# Patient Record
Sex: Male | Born: 1955
Health system: Southern US, Community
[De-identification: ages and names within clinical notes are randomized; demographics above are authoritative.]

## PROBLEM LIST (undated history)

## (undated) DIAGNOSIS — M4722 Other spondylosis with radiculopathy, cervical region: Secondary | ICD-10-CM

## (undated) DIAGNOSIS — G4730 Sleep apnea, unspecified: Secondary | ICD-10-CM

## (undated) DIAGNOSIS — I484 Atypical atrial flutter: Secondary | ICD-10-CM

## (undated) DIAGNOSIS — G2581 Restless legs syndrome: Secondary | ICD-10-CM

## (undated) DIAGNOSIS — N400 Enlarged prostate without lower urinary tract symptoms: Secondary | ICD-10-CM

## (undated) DIAGNOSIS — M4712 Other spondylosis with myelopathy, cervical region: Secondary | ICD-10-CM

## (undated) HISTORY — PX: HERNIA REPAIR: SHX51

## (undated) HISTORY — DX: Atypical atrial flutter: I48.4

## (undated) HISTORY — PX: APPENDECTOMY: SHX54

## (undated) HISTORY — DX: Sleep apnea, unspecified: G47.30

## (undated) HISTORY — PX: TONSILLECTOMY: SUR1361

---

## 2004-12-29 ENCOUNTER — Ambulatory Visit: Payer: Self-pay | Admitting: Surgery

## 2005-06-16 ENCOUNTER — Ambulatory Visit: Payer: Self-pay | Admitting: Surgery

## 2007-10-24 HISTORY — PX: BACK SURGERY: SHX140

## 2008-02-05 ENCOUNTER — Ambulatory Visit: Payer: Self-pay | Admitting: Internal Medicine

## 2008-02-12 ENCOUNTER — Ambulatory Visit (HOSPITAL_COMMUNITY): Admission: RE | Admit: 2008-02-12 | Discharge: 2008-02-12 | Payer: Self-pay | Admitting: Neurosurgery

## 2009-10-25 ENCOUNTER — Ambulatory Visit: Payer: Self-pay | Admitting: Surgery

## 2010-11-04 ENCOUNTER — Ambulatory Visit: Payer: Self-pay | Admitting: Unknown Physician Specialty

## 2011-03-07 NOTE — Op Note (Signed)
NAMEHORACIO, Randall Shannon                 ACCOUNT NO.:  [id number]   MEDICAL RECORD NO.:  [id number]          PATIENT TYPE:  AMB   LOCATION:  SDS                          FACILITY:  [facility]   PHYSICIAN:  Cristi Loron, M.D.DATE OF BIRTH:  15-Jun-1956   DATE OF PROCEDURE:  02/12/2008  DATE OF DISCHARGE:                               OPERATIVE REPORT   PREOPERATIVE DIAGNOSES:  Right L4-5 herniated nucleus pulposus, disk  degeneration, spinal stenosis, lumbar radiculopathy, lumbago.   POSTOPERATIVE DIAGNOSES:  Right L4-5 herniated nucleus pulposus, disk  degeneration, spinal stenosis, lumbar radiculopathy, lumbago   PROCEDURE:  1. Right L5 laminectomy with right L4 laminotomy.  2. Decompress the right L5 and S1 nerve roots using microdissection.   BRIEF HISTORY:  The patient is a 55 year old white male who has suffered  from severe back and right leg pain consistent with a right L5 and S1  radiculopathy.  He failed medical measures, was worked up with a lumbar  MRI, which demonstrated the patient had a herniated disk behind the L5  vertebral body, which likely emanated from the L4-5 interspace.  He  failed medical management, and I have discussed the various treatment  options with him including surgery.  The patient has weighed the risks,  benefits, and alternatives of surgery, and decided to proceed with a  right L4-5 diskectomy.   SURGEON:  Cristi Loron, MD   ASSISTANT:  Clydene Fake, MD   ANESTHESIA:  General endotracheal.   ESTIMATED BLOOD LOSS:  50 mL.   SPECIMENS:  None.   DRAINS:  None.   COMPLICATIONS:  None.   DESCRIPTION OF PROCEDURE:  The patient was brought to the operating room  by the ICU team.  General endotracheal anesthesia was induced.  The  patient was then turned to the prone position on the Wilson frame.  His  lumbosacral region was then prepared with Betadine scrub and Betadine  solution.  Sterile drapes were applied. I then injected  the area  to be  incised with Marcaine with epinephrine solution.  I used a scalpel to  make a linear midline incision over the L4-5 and L5-S1 interspaces.  I  used electrocautery to perform a periosteal dissection exposing the  right spinous process and lamina of L4, L5, and at the sacrum.  We  obtained intraoperative radiograph to confirm our location and then we  inserted a McCullough retractor for exposure.  We then brought the  operative microscope into the field and on electromagnification and  illumination, we completed the microdissection/decompression.  We used a  high-speed drill to perform a right L4-L5 laminotomy.  I widened the  laminotomy at L4 using the Kerrison punch, removed the right L4-5  ligamentum flavum, completed the right L5 laminectomy using Kerrison  punch, and removed the cephalad aspect of the right L5-S1 ligamentum  flavum.  The patient had quite a bit of stenosis, which is  multifactorial likely secondary to disk herniation as well as  multifactorial spinal stenosis, facet arthropathy, and ligamentum flavum  hypertrophy.  I performed a laminotomy at the right L5 and  S1 nerve root  decompressing the lateral recess.  We then inspected the intervertebral  disk.  There was a large hole in the annulus at L4-5 with an impending  herniation.  We also encountered the expected large free fragment disk  herniation, which had migrated caudally over the L5 vertebral body.  There was quite a bit of compression of the right L5 nerve root to a  lesser extent the S1 nerve root.  We removed the disk herniation and  mobile fragments using microdissection.  We then again inspected the L4-  5 intervertebral disk.  Because of the large  impending disc herniation,  I therefore incised the annulus fibrosis within the scalpel.  We  performed a partial intervertebral diskectomy with the pituitary forceps  and curettes. After we were satisfied with the intervertebral  diskectomy, we used the  Acufex tool to remove some redundant posterior  longitudinal ligament from the vertebral endplates further decompressing  the neural structures.  We then used the coronary dilator to palpate  along the major surface of thecal sac and  along the exit route of the  right L5-S1 nerve root noting that all structures were well  decompressed.  We obtained hemostasis using bipolar electrocautery.  We  irrigated the wound out with bacitracin solution.  We then removed the  retractor and then reapproximated the patient's thoracolumbar fascia  with interrupted #1 Vicryl suture, subcutaneous tissue with interrupted  2-0 Vicryl suture, and the skin with Steri-Strips and benzoin.  The  wound was then closed with bacitracin ointment.  A sterile dressing was  applied.  The drapes were removed.  The patient was subsequently  returned to the supine position were he was extubated by the anesthesia  team and transported to the postanesthesia care unit in stable  condition.  All sponge, instrument, and needle counts were correct at  the end of the case.      Cristi Loron, M.D.  Electronically Signed     JDJ/MEDQ  D:  02/12/2008  T:  02/13/2008  Job:  [id number]

## 2011-07-05 ENCOUNTER — Ambulatory Visit: Payer: Self-pay | Admitting: Family Medicine

## 2011-07-18 LAB — CBC
HCT: 43.5
Hemoglobin: 14.6
MCHC: 33.6
MCV: 96.8
Platelets: 267
RBC: 4.49
RDW: 13.5
WBC: 6.6

## 2014-01-27 ENCOUNTER — Ambulatory Visit: Payer: Self-pay | Admitting: Internal Medicine

## 2014-01-28 ENCOUNTER — Other Ambulatory Visit: Payer: Self-pay | Admitting: Neurosurgery

## 2014-01-29 ENCOUNTER — Encounter (HOSPITAL_COMMUNITY): Payer: Self-pay | Admitting: Pharmacy Technician

## 2014-02-02 ENCOUNTER — Inpatient Hospital Stay (HOSPITAL_COMMUNITY): Admission: RE | Admit: 2014-02-02 | Payer: 59 | Source: Ambulatory Visit

## 2014-02-02 NOTE — Pre-Procedure Instructions (Signed)
CLIFTON SAFLEY  02/02/2014   Your procedure is scheduled on:  Wed, April 15 @ 5:10 PM  Report to [facility]  at 2:00 PM.  Call this number if you have problems the morning of surgery: [phone]   Remember:   Do not eat food or drink liquids after midnight.                 Stop taking your Ibuprofen. No Goody's,BC's,Aleve,Aspirin,Fish Oil,or any Herbal Medications   Do not wear jewelry  Do not wear lotions, powders, or colognes. You may wear deodorant.  Men may shave face and neck.  Do not bring valuables to the hospital.  [facility] is not responsible                  for any belongings or valuables.               Contacts, dentures or bridgework may not be worn into surgery.  Leave suitcase in the car. After surgery it may be brought to your room.  For patients admitted to the hospital, discharge time is determined by your                treatment team.               Patients discharged the day of surgery will not be allowed to drive  home.    Special Instructions:  [place] - Preparing for Surgery  Before surgery, you can play an important role.  Because skin is not sterile, your skin needs to be as free of germs as possible.  You can reduce the number of germs on you skin by washing with CHG (chlorahexidine gluconate) soap before surgery.  CHG is an antiseptic cleaner which kills germs and bonds with the skin to continue killing germs even after washing.  Please DO NOT use if you have an allergy to CHG or antibacterial soaps.  If your skin becomes reddened/irritated stop using the CHG and inform your nurse when you arrive at Short Stay.  Do not shave (including legs and underarms) for at least 48 hours prior to the first CHG shower.  You may shave your face.  Please follow these instructions carefully:   1.  Shower with CHG Soap the night before surgery and the                                morning of Surgery.  2.  If you choose to wash your hair, wash your  hair first as usual with your       normal shampoo.  3.  After you shampoo, rinse your hair and body thoroughly to remove the                      Shampoo.  4.  Use CHG as you would any other liquid soap.  You can apply chg directly       to the skin and wash gently with scrungie or a clean washcloth.  5.  Apply the CHG Soap to your body ONLY FROM THE NECK DOWN.        Do not use on open wounds or open sores.  Avoid contact with your eyes,       ears, mouth and genitals (private parts).  Wash genitals (private parts)       with your normal soap.  6.  Wash thoroughly, paying special attention to the area where your surgery        will be performed.  7.  Thoroughly rinse your body with warm water from the neck down.  8.  DO NOT shower/wash with your normal soap after using and rinsing off       the CHG Soap.  9.  Pat yourself dry with a clean towel.            10.  Wear clean pajamas.            11.  Place clean sheets on your bed the night of your first shower and do not        sleep with pets.  Day of Surgery  Do not apply any lotions/deoderants the morning of surgery.  Please wear clean clothes to the hospital/surgery center.     Please read over the following fact sheets that you were given: Pain Booklet, Coughing and Deep Breathing, MRSA Information and Surgical Site Infection Prevention

## 2014-02-03 ENCOUNTER — Encounter (HOSPITAL_COMMUNITY): Payer: Self-pay

## 2014-02-03 ENCOUNTER — Encounter (HOSPITAL_COMMUNITY)
Admission: RE | Admit: 2014-02-03 | Discharge: 2014-02-03 | Disposition: A | Discharge status: Home / Self Care | Payer: 59 | Source: Ambulatory Visit | Attending: Neurosurgery | Admitting: Neurosurgery

## 2014-02-03 LAB — BASIC METABOLIC PANEL
BUN: 17 mg/dL (ref 6–23)
CHLORIDE: 101 meq/L (ref 96–112)
CO2: 28 meq/L (ref 19–32)
Calcium: 9.5 mg/dL (ref 8.4–10.5)
Creatinine, Ser: 1.13 mg/dL (ref 0.50–1.35)
GFR calc Af Amer: 80 mL/min — ABNORMAL LOW (ref >90)
GFR calc non Af Amer: 69 mL/min — ABNORMAL LOW (ref >90)
GLUCOSE: 106 mg/dL — AB (ref 70–99)
POTASSIUM: 5.2 meq/L (ref 3.7–5.3)
SODIUM: 141 meq/L (ref 137–147)

## 2014-02-03 LAB — CBC
HEMATOCRIT: 46.1 % (ref 39.0–52.0)
HEMOGLOBIN: 16.2 g/dL (ref 13.0–17.0)
MCH: 33.9 pg (ref 26.0–34.0)
MCHC: 35.1 g/dL (ref 30.0–36.0)
MCV: 96.4 fL (ref 78.0–100.0)
Platelets: 291 10*3/uL (ref 150–400)
RBC: 4.78 MIL/uL (ref 4.22–5.81)
RDW: 12.5 % (ref 11.5–15.5)
WBC: 6.7 10*3/uL (ref 4.0–10.5)

## 2014-02-03 LAB — SURGICAL PCR SCREEN
MRSA, PCR: NEGATIVE
Staphylococcus aureus: NEGATIVE

## 2014-02-03 MED ORDER — CEFAZOLIN SODIUM-DEXTROSE 2-3 GM-% IV SOLR
2.0000 g | INTRAVENOUS | Status: AC
  Administered 2014-02-04: 2 g via INTRAVENOUS
  Filled 2014-02-03: qty 50

## 2014-02-04 ENCOUNTER — Encounter (HOSPITAL_COMMUNITY): Payer: 59 | Admitting: Anesthesiology

## 2014-02-04 ENCOUNTER — Ambulatory Visit (HOSPITAL_COMMUNITY): Payer: 59 | Admitting: Anesthesiology

## 2014-02-04 ENCOUNTER — Encounter (HOSPITAL_COMMUNITY)
Admission: RE | Disposition: A | Discharge status: Home / Self Care | Payer: Self-pay | Source: Ambulatory Visit | Attending: Neurosurgery

## 2014-02-04 ENCOUNTER — Encounter (HOSPITAL_COMMUNITY): Payer: Self-pay | Admitting: Surgery

## 2014-02-04 ENCOUNTER — Ambulatory Visit (HOSPITAL_COMMUNITY): Payer: 59

## 2014-02-04 ENCOUNTER — Observation Stay (HOSPITAL_COMMUNITY)
Admission: RE | Admit: 2014-02-04 | Discharge: 2014-02-05 | Disposition: A | Discharge status: Home / Self Care | Payer: 59 | Source: Ambulatory Visit | Attending: Neurosurgery | Admitting: Neurosurgery

## 2014-02-04 DIAGNOSIS — IMO0002 Reserved for concepts with insufficient information to code with codable children: Secondary | ICD-10-CM | POA: Insufficient documentation

## 2014-02-04 DIAGNOSIS — Z01812 Encounter for preprocedural laboratory examination: Secondary | ICD-10-CM | POA: Insufficient documentation

## 2014-02-04 DIAGNOSIS — M48062 Spinal stenosis, lumbar region with neurogenic claudication: Principal | ICD-10-CM | POA: Diagnosis present

## 2014-02-04 DIAGNOSIS — M48061 Spinal stenosis, lumbar region without neurogenic claudication: Secondary | ICD-10-CM | POA: Insufficient documentation

## 2014-02-04 HISTORY — PX: LUMBAR LAMINECTOMY/DECOMPRESSION MICRODISCECTOMY: SHX5026

## 2014-02-04 SURGERY — LUMBAR LAMINECTOMY/DECOMPRESSION MICRODISCECTOMY 1 LEVEL
Anesthesia: General

## 2014-02-04 MED ORDER — HYDROMORPHONE HCL PF 1 MG/ML IJ SOLN
0.2500 mg | INTRAMUSCULAR | Status: DC | PRN
  Administered 2014-02-04 (×2): 0.5 mg via INTRAVENOUS

## 2014-02-04 MED ORDER — ONDANSETRON HCL 4 MG/2ML IJ SOLN: 4.0000 mg | Freq: Once | INTRAMUSCULAR | Status: DC | PRN

## 2014-02-04 MED ORDER — PHENYLEPHRINE 40 MCG/ML (10ML) SYRINGE FOR IV PUSH (FOR BLOOD PRESSURE SUPPORT)
PREFILLED_SYRINGE | INTRAVENOUS | Status: AC
  Filled 2014-02-04: qty 10

## 2014-02-04 MED ORDER — ONDANSETRON HCL 4 MG/2ML IJ SOLN: 4.0000 mg | INTRAMUSCULAR | Status: DC | PRN

## 2014-02-04 MED ORDER — MIDAZOLAM HCL 5 MG/5ML IJ SOLN
INTRAMUSCULAR | Status: DC | PRN
  Administered 2014-02-04: 2 mg via INTRAVENOUS

## 2014-02-04 MED ORDER — BUPIVACAINE LIPOSOME 1.3 % IJ SUSP
INTRAMUSCULAR | Status: DC | PRN
  Administered 2014-02-04: 20 mL

## 2014-02-04 MED ORDER — OXYCODONE HCL 5 MG PO TABS: 5.0000 mg | ORAL_TABLET | Freq: Once | ORAL | Status: DC | PRN

## 2014-02-04 MED ORDER — PROPOFOL 10 MG/ML IV BOLUS
INTRAVENOUS | Status: AC
  Filled 2014-02-04: qty 20

## 2014-02-04 MED ORDER — MORPHINE SULFATE 2 MG/ML IJ SOLN
1.0000 mg | INTRAMUSCULAR | Status: DC | PRN
  Filled 2014-02-04: qty 1

## 2014-02-04 MED ORDER — TAMSULOSIN HCL 0.4 MG PO CAPS
0.4000 mg | ORAL_CAPSULE | Freq: Every day | ORAL | Status: DC
  Administered 2014-02-04: 0.4 mg via ORAL
  Filled 2014-02-04 (×2): qty 1

## 2014-02-04 MED ORDER — BACITRACIN ZINC 500 UNIT/GM EX OINT
TOPICAL_OINTMENT | CUTANEOUS | Status: DC | PRN
  Administered 2014-02-04: 1 via TOPICAL

## 2014-02-04 MED ORDER — PROPOFOL 10 MG/ML IV BOLUS
INTRAVENOUS | Status: DC | PRN
  Administered 2014-02-04: 200 mg via INTRAVENOUS

## 2014-02-04 MED ORDER — LIDOCAINE HCL (CARDIAC) 20 MG/ML IV SOLN
INTRAVENOUS | Status: AC
  Filled 2014-02-04: qty 5

## 2014-02-04 MED ORDER — DEXTROSE 5 % IV SOLN
10.0000 mg | INTRAVENOUS | Status: DC | PRN
  Administered 2014-02-04: 25 ug/min via INTRAVENOUS

## 2014-02-04 MED ORDER — FENTANYL CITRATE 0.05 MG/ML IJ SOLN
INTRAMUSCULAR | Status: AC
  Filled 2014-02-04: qty 5

## 2014-02-04 MED ORDER — PHENYLEPHRINE HCL 10 MG/ML IJ SOLN
INTRAMUSCULAR | Status: DC | PRN
Start: 2014-02-04 — End: 2014-02-04
  Administered 2014-02-04: 120 ug via INTRAVENOUS
  Administered 2014-02-04: 80 ug via INTRAVENOUS

## 2014-02-04 MED ORDER — OXYCODONE HCL 5 MG/5ML PO SOLN: 5.0000 mg | Freq: Once | ORAL | Status: DC | PRN

## 2014-02-04 MED ORDER — OXYCODONE-ACETAMINOPHEN 5-325 MG PO TABS
1.0000 | ORAL_TABLET | ORAL | Status: DC | PRN
  Administered 2014-02-05: 2 via ORAL
  Filled 2014-02-04: qty 2

## 2014-02-04 MED ORDER — BUPIVACAINE LIPOSOME 1.3 % IJ SUSP
20.0000 mL | INTRAMUSCULAR | Status: DC
  Filled 2014-02-04: qty 20

## 2014-02-04 MED ORDER — GLYCOPYRROLATE 0.2 MG/ML IJ SOLN
INTRAMUSCULAR | Status: DC | PRN
  Administered 2014-02-04: .7 mg via INTRAVENOUS

## 2014-02-04 MED ORDER — LACTATED RINGERS IV SOLN
INTRAVENOUS | Status: DC
  Administered 2014-02-04 (×2): via INTRAVENOUS

## 2014-02-04 MED ORDER — ROCURONIUM BROMIDE 100 MG/10ML IV SOLN
INTRAVENOUS | Status: DC | PRN
  Administered 2014-02-04: 50 mg via INTRAVENOUS

## 2014-02-04 MED ORDER — DIAZEPAM 5 MG PO TABS
5.0000 mg | ORAL_TABLET | Freq: Four times a day (QID) | ORAL | Status: DC | PRN
Start: 2014-02-04 — End: 2014-02-05

## 2014-02-04 MED ORDER — LACTATED RINGERS IV SOLN
INTRAVENOUS | Status: DC
  Administered 2014-02-05: 03:00:00 via INTRAVENOUS

## 2014-02-04 MED ORDER — ALUM & MAG HYDROXIDE-SIMETH 200-200-20 MG/5ML PO SUSP: 30.0000 mL | Freq: Four times a day (QID) | ORAL | Status: DC | PRN

## 2014-02-04 MED ORDER — LIDOCAINE HCL (CARDIAC) 20 MG/ML IV SOLN
INTRAVENOUS | Status: DC | PRN
  Administered 2014-02-04: 40 mg via INTRAVENOUS

## 2014-02-04 MED ORDER — DOCUSATE SODIUM 100 MG PO CAPS
100.0000 mg | ORAL_CAPSULE | Freq: Two times a day (BID) | ORAL | Status: DC
Start: 2014-02-04 — End: 2014-02-05
  Administered 2014-02-04 – 2014-02-05 (×2): 100 mg via ORAL
  Filled 2014-02-04 (×2): qty 1

## 2014-02-04 MED ORDER — ROCURONIUM BROMIDE 50 MG/5ML IV SOLN
INTRAVENOUS | Status: AC
  Filled 2014-02-04: qty 1

## 2014-02-04 MED ORDER — NEOSTIGMINE METHYLSULFATE 1 MG/ML IJ SOLN
INTRAMUSCULAR | Status: DC | PRN
  Administered 2014-02-04: 4 mg via INTRAVENOUS

## 2014-02-04 MED ORDER — HYDROCODONE-ACETAMINOPHEN 5-325 MG PO TABS
1.0000 | ORAL_TABLET | ORAL | Status: DC | PRN
  Administered 2014-02-04 – 2014-02-05 (×2): 2 via ORAL
  Filled 2014-02-04 (×3): qty 2

## 2014-02-04 MED ORDER — ARTIFICIAL TEARS OP OINT
TOPICAL_OINTMENT | OPHTHALMIC | Status: DC | PRN
  Administered 2014-02-04: 1 via OPHTHALMIC

## 2014-02-04 MED ORDER — PHENOL 1.4 % MT LIQD: 1.0000 | OROMUCOSAL | Status: DC | PRN

## 2014-02-04 MED ORDER — ONDANSETRON HCL 4 MG/2ML IJ SOLN
INTRAMUSCULAR | Status: DC | PRN
  Administered 2014-02-04: 4 mg via INTRAVENOUS

## 2014-02-04 MED ORDER — HEMOSTATIC AGENTS (NO CHARGE) OPTIME
TOPICAL | Status: DC | PRN
  Administered 2014-02-04: 1 via TOPICAL

## 2014-02-04 MED ORDER — SODIUM CHLORIDE 0.9 % IR SOLN
Status: DC | PRN
  Administered 2014-02-04: 17:00:00

## 2014-02-04 MED ORDER — CEFAZOLIN SODIUM-DEXTROSE 2-3 GM-% IV SOLR
2.0000 g | Freq: Three times a day (TID) | INTRAVENOUS | Status: AC
  Administered 2014-02-04 – 2014-02-05 (×2): 2 g via INTRAVENOUS
  Filled 2014-02-04 (×2): qty 50

## 2014-02-04 MED ORDER — THROMBIN 5000 UNITS EX SOLR
CUTANEOUS | Status: DC | PRN
  Administered 2014-02-04 (×2): 5000 [IU] via TOPICAL

## 2014-02-04 MED ORDER — PRAMIPEXOLE DIHYDROCHLORIDE 1.5 MG PO TABS
1.5000 mg | ORAL_TABLET | Freq: Every day | ORAL | Status: DC
  Administered 2014-02-04: 1.5 mg via ORAL
  Filled 2014-02-04 (×2): qty 1

## 2014-02-04 MED ORDER — ACETAMINOPHEN 325 MG PO TABS: 650.0000 mg | ORAL_TABLET | ORAL | Status: DC | PRN

## 2014-02-04 MED ORDER — HYDROMORPHONE HCL PF 1 MG/ML IJ SOLN
INTRAMUSCULAR | Status: AC
  Filled 2014-02-04: qty 1

## 2014-02-04 MED ORDER — FENTANYL CITRATE 0.05 MG/ML IJ SOLN
INTRAMUSCULAR | Status: DC | PRN
  Administered 2014-02-04: 50 ug via INTRAVENOUS
  Administered 2014-02-04: 100 ug via INTRAVENOUS
  Administered 2014-02-04 (×2): 50 ug via INTRAVENOUS

## 2014-02-04 MED ORDER — BUPIVACAINE-EPINEPHRINE PF 0.5-1:200000 % IJ SOLN
INTRAMUSCULAR | Status: DC | PRN
  Administered 2014-02-04: 10 mL

## 2014-02-04 MED ORDER — MENTHOL 3 MG MT LOZG: 1.0000 | LOZENGE | OROMUCOSAL | Status: DC | PRN

## 2014-02-04 MED ORDER — ACETAMINOPHEN 650 MG RE SUPP: 650.0000 mg | RECTAL | Status: DC | PRN

## 2014-02-04 MED ORDER — MIDAZOLAM HCL 2 MG/2ML IJ SOLN
INTRAMUSCULAR | Status: AC
  Filled 2014-02-04: qty 2

## 2014-02-04 SURGICAL SUPPLY — 56 items
BAG DECANTER FOR FLEXI CONT (MISCELLANEOUS) ×2 IMPLANT
BENZOIN TINCTURE PRP APPL 2/3 (GAUZE/BANDAGES/DRESSINGS) ×2 IMPLANT
BLADE SURG ROTATE 9660 (MISCELLANEOUS) IMPLANT
BRUSH SCRUB EZ PLAIN DRY (MISCELLANEOUS) ×2 IMPLANT
BUR ACORN 6.0 (BURR) ×2 IMPLANT
BUR MATCHSTICK NEURO 3.0 LAGG (BURR) ×2 IMPLANT
CANISTER SUCT 3000ML (MISCELLANEOUS) ×2 IMPLANT
CONT SPEC 4OZ CLIKSEAL STRL BL (MISCELLANEOUS) ×2 IMPLANT
DRAPE LAPAROTOMY 100X72X124 (DRAPES) ×2 IMPLANT
DRAPE MICROSCOPE LEICA (MISCELLANEOUS) IMPLANT
DRAPE POUCH INSTRU U-SHP 10X18 (DRAPES) ×2 IMPLANT
DRAPE SURG 17X23 STRL (DRAPES) ×8 IMPLANT
ELECT BLADE 4.0 EZ CLEAN MEGAD (MISCELLANEOUS) ×2
ELECT REM PT RETURN 9FT ADLT (ELECTROSURGICAL) ×2
ELECTRODE BLDE 4.0 EZ CLN MEGD (MISCELLANEOUS) ×1 IMPLANT
ELECTRODE REM PT RTRN 9FT ADLT (ELECTROSURGICAL) ×1 IMPLANT
GAUZE SPONGE 4X4 16PLY XRAY LF (GAUZE/BANDAGES/DRESSINGS) IMPLANT
GLOVE BIO SURGEON STRL SZ8.5 (GLOVE) ×2 IMPLANT
GLOVE BIOGEL M 8.0 STRL (GLOVE) ×2 IMPLANT
GLOVE BIOGEL PI IND STRL 7.5 (GLOVE) ×1 IMPLANT
GLOVE BIOGEL PI INDICATOR 7.5 (GLOVE) ×1
GLOVE EXAM NITRILE LRG STRL (GLOVE) IMPLANT
GLOVE EXAM NITRILE MD LF STRL (GLOVE) IMPLANT
GLOVE EXAM NITRILE XL STR (GLOVE) IMPLANT
GLOVE EXAM NITRILE XS STR PU (GLOVE) IMPLANT
GLOVE SS BIOGEL STRL SZ 8 (GLOVE) ×1 IMPLANT
GLOVE SUPERSENSE BIOGEL SZ 8 (GLOVE) ×1
GLOVE SURG SS PI 7.0 STRL IVOR (GLOVE) ×4 IMPLANT
GOWN BRE IMP SLV AUR LG STRL (GOWN DISPOSABLE) IMPLANT
GOWN BRE IMP SLV AUR XL STRL (GOWN DISPOSABLE) IMPLANT
GOWN STRL REIN 2XL LVL4 (GOWN DISPOSABLE) IMPLANT
GOWN STRL REUS W/ TWL LRG LVL3 (GOWN DISPOSABLE) ×1 IMPLANT
GOWN STRL REUS W/ TWL XL LVL3 (GOWN DISPOSABLE) ×2 IMPLANT
GOWN STRL REUS W/TWL LRG LVL3 (GOWN DISPOSABLE) ×1
GOWN STRL REUS W/TWL XL LVL3 (GOWN DISPOSABLE) ×2
KIT BASIN OR (CUSTOM PROCEDURE TRAY) ×2 IMPLANT
KIT ROOM TURNOVER OR (KITS) ×2 IMPLANT
NEEDLE HYPO 21X1.5 SAFETY (NEEDLE) ×2 IMPLANT
NEEDLE HYPO 22GX1.5 SAFETY (NEEDLE) ×2 IMPLANT
NS IRRIG 1000ML POUR BTL (IV SOLUTION) ×2 IMPLANT
PACK LAMINECTOMY NEURO (CUSTOM PROCEDURE TRAY) ×2 IMPLANT
PAD ARMBOARD 7.5X6 YLW CONV (MISCELLANEOUS) ×6 IMPLANT
PATTIES SURGICAL .5 X1 (DISPOSABLE) IMPLANT
RUBBERBAND STERILE (MISCELLANEOUS) IMPLANT
SPONGE GAUZE 4X4 12PLY (GAUZE/BANDAGES/DRESSINGS) ×2 IMPLANT
SPONGE SURGIFOAM ABS GEL SZ50 (HEMOSTASIS) ×2 IMPLANT
STRIP CLOSURE SKIN 1/2X4 (GAUZE/BANDAGES/DRESSINGS) ×2 IMPLANT
SUT VIC AB 1 CT1 18XBRD ANBCTR (SUTURE) ×1 IMPLANT
SUT VIC AB 1 CT1 8-18 (SUTURE) ×1
SUT VIC AB 2-0 CP2 18 (SUTURE) ×2 IMPLANT
SYR 20CC LL (SYRINGE) ×2 IMPLANT
SYR 20ML ECCENTRIC (SYRINGE) ×2 IMPLANT
TAPE CLOTH SURG 4X10 WHT LF (GAUZE/BANDAGES/DRESSINGS) ×2 IMPLANT
TOWEL OR 17X24 6PK STRL BLUE (TOWEL DISPOSABLE) ×2 IMPLANT
TOWEL OR 17X26 10 PK STRL BLUE (TOWEL DISPOSABLE) ×2 IMPLANT
WATER STERILE IRR 1000ML POUR (IV SOLUTION) ×2 IMPLANT

## 2014-02-04 NOTE — H&P (Signed)
Subjective: The patient is a 58 year old white male who's had previous back problems. I performed a right L4-5 discectomy on the patient back in 2009. He has done well for years but developed recurrent back, buttock, and leg pain consistent with neurogenic claudication. He has failed medical management and was worked up with a lumbar MRI. This demonstrates spinal stenosis at L2-3 and L3-4. I discussed the various treatment options with the patient including a decompressive laminectomy. The patient has weighed the risks, benefits, and alternatives surgery and decided proceed with the operation.   History reviewed. No pertinent past medical history.  Past Surgical History  Procedure Laterality Date  . Back surgery  2009  . Hernia repair Bilateral     2 left side, 1 on right inguinal  . Appendectomy    . Tonsillectomy      Allergies  Allergen Reactions  . Oysters [Shellfish Allergy] Other (See Comments)    GI upset    History  Substance Use Topics  . Smoking status: Never Smoker   . Smokeless tobacco: Never Used     Comment: cigar occasionally  . Alcohol Use: Yes     Comment: Red wine    History reviewed. No pertinent family history. Prior to Admission medications   Medication Sig Start Date End Date Taking? Authorizing Provider  ibuprofen (ADVIL,MOTRIN) 800 MG tablet Take 800 mg by mouth every 8 (eight) hours as needed for moderate pain.   Yes Historical Provider, MD  pramipexole (MIRAPEX) 0.75 MG tablet Take 1.5 mg by mouth at bedtime.   Yes Historical Provider, MD  predniSONE (DELTASONE) 10 MG tablet Take 10 mg by mouth daily with breakfast. As directed per taper   Yes Historical Provider, MD  tamsulosin (FLOMAX) 0.4 MG CAPS capsule Take 0.4 mg by mouth daily after supper.   Yes Historical Provider, MD     Review of Systems  Positive ROS: As above  All other systems have been reviewed and were otherwise negative with the exception of those mentioned in the HPI and as  above.  Objective: Vital signs in last 24 hours: Temp:  [97.9 F (36.6 C)] 97.9 F (36.6 C) (04/15 1401) Pulse Rate:  [83] 83 (04/15 1401) Resp:  [18] 18 (04/15 1401) BP: (127)/(65) 127/65 mmHg (04/15 1401) SpO2:  [99 %] 99 % (04/15 1401) Weight:  [92.534 kg (204 lb)] 92.534 kg (204 lb) (04/15 1401)  General Appearance: Alert, cooperative, no distress, Head: Normocephalic, without obvious abnormality, atraumatic Eyes: PERRL, conjunctiva/corneas clear, EOM's intact,    Ears: Normal  Throat: Normal  Neck: Supple, symmetrical, trachea midline, no adenopathy; thyroid: No enlargement/tenderness/nodules; no carotid bruit or JVD Back: Symmetric, no curvature, ROM normal, no CVA tenderness Lungs: Clear to auscultation bilaterally, respirations unlabored Heart: Regular rate and rhythm, no murmur, rub or gallop Abdomen: Soft, non-tender,, no masses, no organomegaly Extremities: Extremities normal, atraumatic, no cyanosis or edema Pulses: 2+ and symmetric all extremities Skin: Skin color, texture, turgor normal, no rashes or lesions  NEUROLOGIC:   Mental status: alert and oriented, no aphasia, good attention span, Fund of knowledge/ memory ok Motor Exam - grossly normal Sensory Exam - grossly normal Reflexes:  Coordination - grossly normal Gait - grossly normal Balance - grossly normal Cranial Nerves: I: smell Not tested  II: visual acuity  OS: Normal  OD: Normal   II: visual fields Full to confrontation  II: pupils Equal, round, reactive to light  III,VII: ptosis None  III,IV,VI: extraocular muscles  Full ROM  V: mastication  Normal  V: facial light touch sensation  Normal  V,VII: corneal reflex  Present  VII: facial muscle function - upper  Normal  VII: facial muscle function - lower Normal  VIII: hearing Not tested  IX: soft palate elevation  Normal  IX,X: gag reflex Present  XI: trapezius strength  5/5  XI: sternocleidomastoid strength 5/5  XI: neck flexion strength  5/5   XII: tongue strength  Normal    Data Review Lab Results  Component Value Date   WBC 6.7 02/03/2014   HGB 16.2 02/03/2014   HCT 46.1 02/03/2014   MCV 96.4 02/03/2014   PLT 291 02/03/2014   Lab Results  Component Value Date   NA 141 02/03/2014   K 5.2 02/03/2014   CL 101 02/03/2014   CO2 28 02/03/2014   BUN 17 02/03/2014   CREATININE 1.13 02/03/2014   GLUCOSE 106* 02/03/2014   No results found for this basename: INR, PROTIME    Assessment/Plan: L2-3 and L3-4 spinal stenosis, lumbago, lumbar radiculopathy, neurogenic claudication: I discussed the situation with the patient. I reviewed his MR scan with them and pointed out the abnormalities. We have discussed the various treatment options including surgery. I described the surgical treatment option of an L3 laminectomy with L2 laminotomies. I've shown him surgical models. We have discussed the risks, benefits, alternatives, and likelihood of achieving our goals with surgery. I've answered all the patient's questions. He has decided to proceed with surgery.   Ophelia Charter 02/04/2014 3:50 PM

## 2014-02-04 NOTE — Transfer of Care (Signed)
Immediate Anesthesia Transfer of Care Note  Patient: Randall Shannon  Procedure(s) Performed: Procedure(s) with comments: Lumbar Three-Four Laminectomy, Lumbar Two Laminotomy (N/A) - Lumbar Three-Four Laminectomy, Lumbar Two Laminotomy  Patient Location: PACU  Anesthesia Type:General  Level of Consciousness: awake, alert  and oriented  Airway & Oxygen Therapy: Patient Spontanous Breathing and Patient connected to nasal cannula oxygen  Post-op Assessment: Report given to PACU RN and Post -op Vital signs reviewed and stable  Post vital signs: Reviewed and stable  Complications: No apparent anesthesia complications

## 2014-02-04 NOTE — Anesthesia Procedure Notes (Signed)
Procedure Name: Intubation Date/Time: 02/04/2014 3:55 PM Performed by: Carola Frost Pre-anesthesia Checklist: Patient identified, Timeout performed, Emergency Drugs available, Suction available and Patient being monitored Patient Re-evaluated:Patient Re-evaluated prior to inductionOxygen Delivery Method: Circle system utilized Preoxygenation: Pre-oxygenation with 100% oxygen Intubation Type: IV induction Ventilation: Mask ventilation without difficulty Laryngoscope Size: Mac and 4 Grade View: Grade I Tube type: Oral Tube size: 7.5 mm Number of attempts: 1 Airway Equipment and Method: Stylet Placement Confirmation: positive ETCO2,  CO2 detector,  ETT inserted through vocal cords under direct vision and breath sounds checked- equal and bilateral Secured at: 23 cm Tube secured with: Tape Dental Injury: Teeth and Oropharynx as per pre-operative assessment  Comments: Trachea slighly deviated to the left - no issues with intubation.

## 2014-02-04 NOTE — Op Note (Signed)
Brief history: The patient is a 58 year old white male who's had previous back troubles. I performed an L4-5 discectomy on him years ago. The patient has done well until recently when he developed recurrent back, buttock, and leg pain. He has failed medical management and was worked up with a lumbar MRI. This demonstrated spinal stenosis at L2-3 and L3-4. I discussed the various treatment options with the patient including surgery. He has weighed the risks, benefits, and alternatives surgery and decided proceed with a decompressive lumbar laminectomy  Preoperative diagnosis: L2-3 and L3-4 spinal stenosis, lumbago, lumbar radiculopathy, neurogenic claudication  Postoperative diagnosis: The same  Procedure: L3 laminectomy with bilateral L2 laminotomies to decompress the bilateral L3 and L4 nerve roots using microdissection  Surgeon: Dr. Earle Gell  Asst.: Dr. Leeroy Cha  Anesthesia: Gen. endotracheal  Estimated blood loss: Minimal  Drains: None  Complications: None  Description of procedure: The patient was brought to the operating room by the anesthesia team. General endotracheal anesthesia was induced. The patient was turned to the prone position on the Wilson frame. [name] lumbosacral region was then prepared with Betadine scrub and Betadine solution. Sterile drapes were applied.  I then injected the area to be incised with Marcaine with epinephrine solution. I then used a scalpel to make a linear midline incision over the L2-3 and L3-4 intervertebral disc space. I then used electrocautery to perform a lateralsubperiosteal dissection exposing the spinous process and lamina of L2, L3 and L4. We obtained intraoperative radiograph to confirm our location. I then inserted the [facility] retractor for exposure.  We then brought the operative microscope into the field. Under its magnification and illumination we completed the microdissection. I used a high-speed drill to perform a  laminotomy at L3 and L2 bilaterally. I then used a Kerrison punches to widen the laminotomy and removed the ligamentum flavum at L2-3 and L3-4, we also removed the cephalad aspect of the L4 lamina.. We then used microdissection to free up the thecal sac and the bilateral L3 and L4 nerve root from the epidural tissue. I then used a Kerrison punch to perform a foraminotomy at about the bilateral L3 and L4 nerve root. We inspected the intervertebral disc at L3-4. It was not herniated.  I then palpated along the ventral surface of the thecal sac and along exit route of the bilateral L3 and L4 nerve root and noted that the neural structures were well decompressed. This completed the decompression.  We then obtained hemostasis using bipolar electrocautery. We irrigated the wound out with bacitracin solution. We then removed the retractor. We then reapproximated the patient's thoracolumbar fascia with interrupted #1 Vicryl suture. We then reapproximated the patient's subcutaneous tissue with interrupted 2-0 Vicryl suture. We then reapproximated patient's skin with Steri-Strips and benzoin. The was then coated with bacitracin ointment. The drapes were removed. The patient was subsequently returned to the supine position where they were extubated by the anesthesia team. The patient was then transported to the postanesthesia care unit in stable condition. All sponge instrument and needle counts were reportedly correct at the end of this case.

## 2014-02-04 NOTE — Progress Notes (Signed)
Pt arrived on the unit at 1900 via stretcher from PACU. Pt IV remains intact and infusing, incisional dsg dry and intact; no drains or foley; pt on 2 L oxygen; pt able to move from stretcher to bed with no assistance; pt pain assessed; vitals taken; pt oriented to unit; pt spouse and belongings at bedside. Call light within reach and will report off to incoming RN. [name] Deina Lipsey RN.

## 2014-02-04 NOTE — Progress Notes (Signed)
Subjective:  The patient is alert and pleasant. He looks well. He is in no apparent distress.  Objective: Vital signs in last 24 hours: Temp:  [97.9 F (36.6 C)-98 F (36.7 C)] 98 F (36.7 C) (04/15 1800) Pulse Rate:  [83-84] 84 (04/15 1800) Resp:  [18-29] 29 (04/15 1800) BP: (116-127)/(65-67) 116/67 mmHg (04/15 1800) SpO2:  [97 %-99 %] 97 % (04/15 1800) Weight:  [92.534 kg (204 lb)] 92.534 kg (204 lb) (04/15 1401)  Intake/Output from previous day:   Intake/Output this shift: Total I/O In: 1200 [I.V.:1200] Out: -   Physical exam the patient is alert and pleasant. He is moving all 4 extremities well.  Lab Results:  Recent Labs  02/03/14 0835  WBC 6.7  HGB 16.2  HCT 46.1  PLT 291   BMET  Recent Labs  02/03/14 0835  NA 141  K 5.2  CL 101  CO2 28  GLUCOSE 106*  BUN 17  CREATININE 1.13  CALCIUM 9.5    Studies/Results: Dg Lumbar Spine 1 View  02/04/2014   CLINICAL DATA:  Lumbar stenosis  EXAM: LUMBAR SPINE - 1 VIEW  COMPARISON:  MR LUMBAR SPINE W/O CM dated 01/27/2014  FINDINGS: Skin retractors appreciated within the soft tissues of the lower lumbar spine. A surgical probe projects along the inferior border of the spinous process of L4. Degenerative disc disease changes are appreciated at L4-5. No acute osseous abnormalities appreciated.  IMPRESSION: Intra op lumbar Spine landmark evaluation.   Electronically Signed   By: Margaree Mackintosh M.D.   On: 02/04/2014 17:45    Assessment/Plan: The patient is doing well. I spoke with his wife. He will likely go home tomorrow.  LOS: 0 days     Ophelia Charter 02/04/2014, 6:26 PM

## 2014-02-04 NOTE — Anesthesia Preprocedure Evaluation (Signed)
Anesthesia Evaluation  Patient identified by MRN, date of birth, ID band Patient awake    Reviewed: Allergy & Precautions, H&P , NPO status , Patient's Chart, lab work & pertinent test results  Airway Mallampati: II TM Distance: >3 FB Neck ROM: Full    Dental  (+) Teeth Intact, Dental Advisory Given   Pulmonary  breath sounds clear to auscultation        Cardiovascular Rhythm:Regular Rate:Normal     Neuro/Psych    GI/Hepatic   Endo/Other    Renal/GU      Musculoskeletal   Abdominal   Peds  Hematology   Anesthesia Other Findings   Reproductive/Obstetrics                           Anesthesia Physical Anesthesia Plan  ASA: I  Anesthesia Plan: General   Post-op Pain Management:    Induction: Intravenous  Airway Management Planned: Oral ETT  Additional Equipment:   Intra-op Plan:   Post-operative Plan: Extubation in OR  Informed Consent: I have reviewed the patients History and Physical, chart, labs and discussed the procedure including the risks, benefits and alternatives for the proposed anesthesia with the patient or authorized representative who has indicated his/her understanding and acceptance.   Dental advisory given  Plan Discussed with: CRNA and Anesthesiologist  Anesthesia Plan Comments: (Lumbar Spinal Stenosis Restless leg syndrome  Plan GA with oral ETT  Roberts Gaudy, MD)        Anesthesia Quick Evaluation

## 2014-02-05 ENCOUNTER — Encounter (HOSPITAL_COMMUNITY): Payer: Self-pay | Admitting: Neurosurgery

## 2014-02-05 MED ORDER — DIAZEPAM 5 MG PO TABS: 5.0000 mg | ORAL_TABLET | Freq: Four times a day (QID) | ORAL | Status: DC | PRN

## 2014-02-05 MED ORDER — DSS 100 MG PO CAPS: 100.0000 mg | ORAL_CAPSULE | Freq: Two times a day (BID) | ORAL | Status: DC

## 2014-02-05 MED ORDER — OXYCODONE-ACETAMINOPHEN 10-325 MG PO TABS: 1.0000 | ORAL_TABLET | ORAL | Status: DC | PRN

## 2014-02-05 NOTE — Evaluation (Signed)
Physical Therapy Evaluation Patient Details Name: Randall Shannon MRN: [id number] DOB: 19-Mar-1956 Today's Date: 02/05/2014   History of Present Illness  Patient is s/p an L3 laminectomy with L2 laminotomies.  Clinical Impression  Patient has no further acute PT needs. Will sign off.    Follow Up Recommendations No PT follow up    Equipment Recommendations  None recommended by PT    Recommendations for Other Services       Precautions / Restrictions Precautions Precautions: Back Precaution Comments: for comfort      Mobility  Bed Mobility Overal bed mobility: Modified Independent             General bed mobility comments: increased time to perform, VCs for technique with log roll for comfort  Transfers Overall transfer level: Independent                  Ambulation/Gait Ambulation/Gait assistance: Independent Ambulation Distance (Feet): 640 Feet Assistive device: None Gait Pattern/deviations: WFL(Within Functional Limits) Gait velocity: WFL Gait velocity interpretation: at or above normal speed for age/gender    Stairs Stairs: Yes Stairs assistance: Modified independent (Device/Increase time) Stair Management: One rail Right Number of Stairs: 4    Wheelchair Mobility    Modified Rankin (Stroke Patients Only)       Balance Overall balance assessment: No apparent balance deficits (not formally assessed)                                           Pertinent Vitals/Pain 6/10    Home Living Family/patient expects to be discharged to:: Private residence Living Arrangements: Spouse/significant other Available Help at Discharge: Family Type of Home: House Home Access: Stairs to enter Entrance Stairs-Rails: Can reach both Entrance Stairs-Number of Steps: 2 Home Layout: Two level;Bed/bath upstairs Home Equipment: Adaptive equipment Additional Comments: walk in with shower seat, standard toilets    Prior Function Level of  Independence: Independent               Hand Dominance   Dominant Hand: Right    Extremity/Trunk Assessment   Upper Extremity Assessment: Overall WFL for tasks assessed           Lower Extremity Assessment: Overall WFL for tasks assessed         Communication   Communication:  (glasses all the time)  Cognition Arousal/Alertness: Awake/alert Behavior During Therapy: WFL for tasks assessed/performed Overall Cognitive Status: Within Functional Limits for tasks assessed                      General Comments      Exercises        Assessment/Plan    PT Assessment Patent does not need any further PT services  PT Diagnosis     PT Problem List    PT Treatment Interventions     PT Goals (Current goals can be found in the Care Plan section) Acute Rehab PT Goals PT Goal Formulation: No goals set, d/c therapy    Frequency     Barriers to discharge        Co-evaluation               End of Session Equipment Utilized During Treatment: Gait belt Activity Tolerance: Patient tolerated treatment well Patient left: in chair;with call bell/phone within reach Nurse Communication: Mobility status  Time: 0922-0941 PT Time Calculation (min): 19 min   Charges:   PT Evaluation $Initial PT Evaluation Tier I: 1 Procedure PT Treatments $Self Care/Home Management: 8-22   PT G Codes:   Functional Assessment Tool Used: clinical judgement Functional Limitation: Mobility: Walking and moving around    [employer] 02/05/2014, 9:45 AM Alben Deeds, PT DPT  [phone]

## 2014-02-05 NOTE — Progress Notes (Signed)
Patient arrived on 4N room 19 at 2127 hooked SCD's back up and stated his IV fluids back (LR at 75), pt was not complaining of pain, got pt up multiple times to urinate standing beside bed. Will continue to monitor.

## 2014-02-05 NOTE — Discharge Summary (Signed)
  Physician Discharge Summary  Patient ID: Randall Shannon MRN: [id number] DOB/AGE: [birth year] 58 y.o.  Admit date: 02/04/2014 Discharge date: 02/05/2014  Admission Diagnoses: L2-3 and L3-4 spinal stenosis, lumbago, lumbar radiculopathy, neurogenic claudication  Discharge Diagnoses: The same Active Problems:   Lumbar stenosis with neurogenic claudication   Discharged Condition: good  Hospital Course: I performed a L3 laminectomy with L2 laminotomies on the patient's on 02/04/2014. The surgery went well.  [name] postoperative course was unremarkable except that he had some urinary retention which resolved.. On postop day #1 he requested discharge to home. The patient was given oral and written discharge instructions. All his questions were answered.  Consults: PT Significant Diagnostic Studies: None Treatments: L3 laminectomy with bilateral L2 laminotomies to decompress the bilateral L3 and L4 nerve roots using microdissection Discharge Exam: Blood pressure 114/65, pulse 82, temperature 97.9 F (36.6 C), temperature source Oral, resp. rate 16, height 6\' 4"  (1.93 m), weight 92.534 kg (204 lb), SpO2 98.00%. The patient is alert and pleasant. He looks well. His dressing is clean and dry. His strength is normal.  Disposition: Home  Discharge Orders   Future Orders Complete By Expires   Call MD for:  difficulty breathing, headache or visual disturbances  As directed    Call MD for:  extreme fatigue  As directed    Call MD for:  hives  As directed    Call MD for:  persistant dizziness or light-headedness  As directed    Call MD for:  persistant nausea and vomiting  As directed    Call MD for:  redness, tenderness, or signs of infection (pain, swelling, redness, odor or green/yellow discharge around incision site)  As directed    Call MD for:  severe uncontrolled pain  As directed    Call MD for:  temperature >100.4  As directed    Diet - low sodium heart healthy  As directed    Discharge instructions  As directed    Driving Restrictions  As directed    Increase activity slowly  As directed    Lifting restrictions  As directed    May shower / Bathe  As directed    Remove dressing in 48 hours  As directed        Medication List    STOP taking these medications       predniSONE 10 MG tablet  Commonly known as:  DELTASONE      TAKE these medications       diazepam 5 MG tablet  Commonly known as:  VALIUM  Take 1 tablet (5 mg total) by mouth every 6 (six) hours as needed for muscle spasms.     DSS 100 MG Caps  Take 100 mg by mouth 2 (two) times daily.     ibuprofen 800 MG tablet  Commonly known as:  ADVIL,MOTRIN  Take 800 mg by mouth every 8 (eight) hours as needed for moderate pain.     oxyCODONE-acetaminophen 10-325 MG per tablet  Commonly known as:  PERCOCET  Take 1 tablet by mouth every 4 (four) hours as needed for pain.     pramipexole 0.75 MG tablet  Commonly known as:  MIRAPEX  Take 1.5 mg by mouth at bedtime.     tamsulosin 0.4 MG Caps capsule  Commonly known as:  FLOMAX  Take 0.4 mg by mouth daily after supper.         Signed: Ophelia Charter 02/05/2014, 9:36 AM

## 2014-02-05 NOTE — Progress Notes (Signed)
Pt able to void but having some retention. Pt voided 396ml with 504 PVR on scan. Pt not catherized; pt will try to void independently later on. Will continue to monitor. Delia Heady RN

## 2014-02-05 NOTE — Progress Notes (Signed)
Pt voided voided multiple times with post void residuals checked. Pt lastly voided at 1305 for 322ml and PVR of 153ml. Pt diet changed to regular diet and pt able to tolerate meal with no problem. Pt denies any nausea or vomiting. Pt to be discharge home as ordered my MD. Delia Heady RN.

## 2014-02-05 NOTE — Progress Notes (Signed)
Pt transported off unit via wheelchair with spouse and belongings at side. [name] Tao Satz RN.

## 2014-02-05 NOTE — Anesthesia Postprocedure Evaluation (Signed)
  Anesthesia Post-op Note  Patient: Randall Shannon  Procedure(s) Performed: Procedure(s) with comments: Lumbar Three-Four Laminectomy, Lumbar Two Laminotomy (N/A) - Lumbar Three-Four Laminectomy, Lumbar Two Laminotomy  Patient Location: PACU  Anesthesia Type:General  Level of Consciousness: awake, alert  and oriented  Airway and Oxygen Therapy: Patient Spontanous Breathing and Patient connected to nasal cannula oxygen  Post-op Pain: mild  Post-op Assessment: Post-op Vital signs reviewed, Patient's Cardiovascular Status Stable, Respiratory Function Stable, Patent Airway and Pain level controlled  Post-op Vital Signs: stable  Last Vitals:  Filed Vitals:   02/05/14 1021  BP: 128/79  Pulse: 82  Temp: 36.6 C  Resp: 18    Complications: No apparent anesthesia complications

## 2014-02-05 NOTE — Progress Notes (Signed)
Pt A&O x4, pt discharge education completed with spouse at bedside. Both voices understanding and denies any questions. Pt given his prescription for percocet and vailum. Will continue to monitor pt quietly till he leaves to his disposition. [name] Analyn Matusek RN.

## 2015-04-13 IMAGING — DX DG LUMBAR SPINE 1V
1 series · 1 of 1 positions shown · non-contrast
Comparison: MR LUMBAR SPINE W/O CM dated 01/27/2014

CLINICAL DATA: Lumbar stenosis

EXAM:
LUMBAR SPINE - 1 VIEW

[lat]
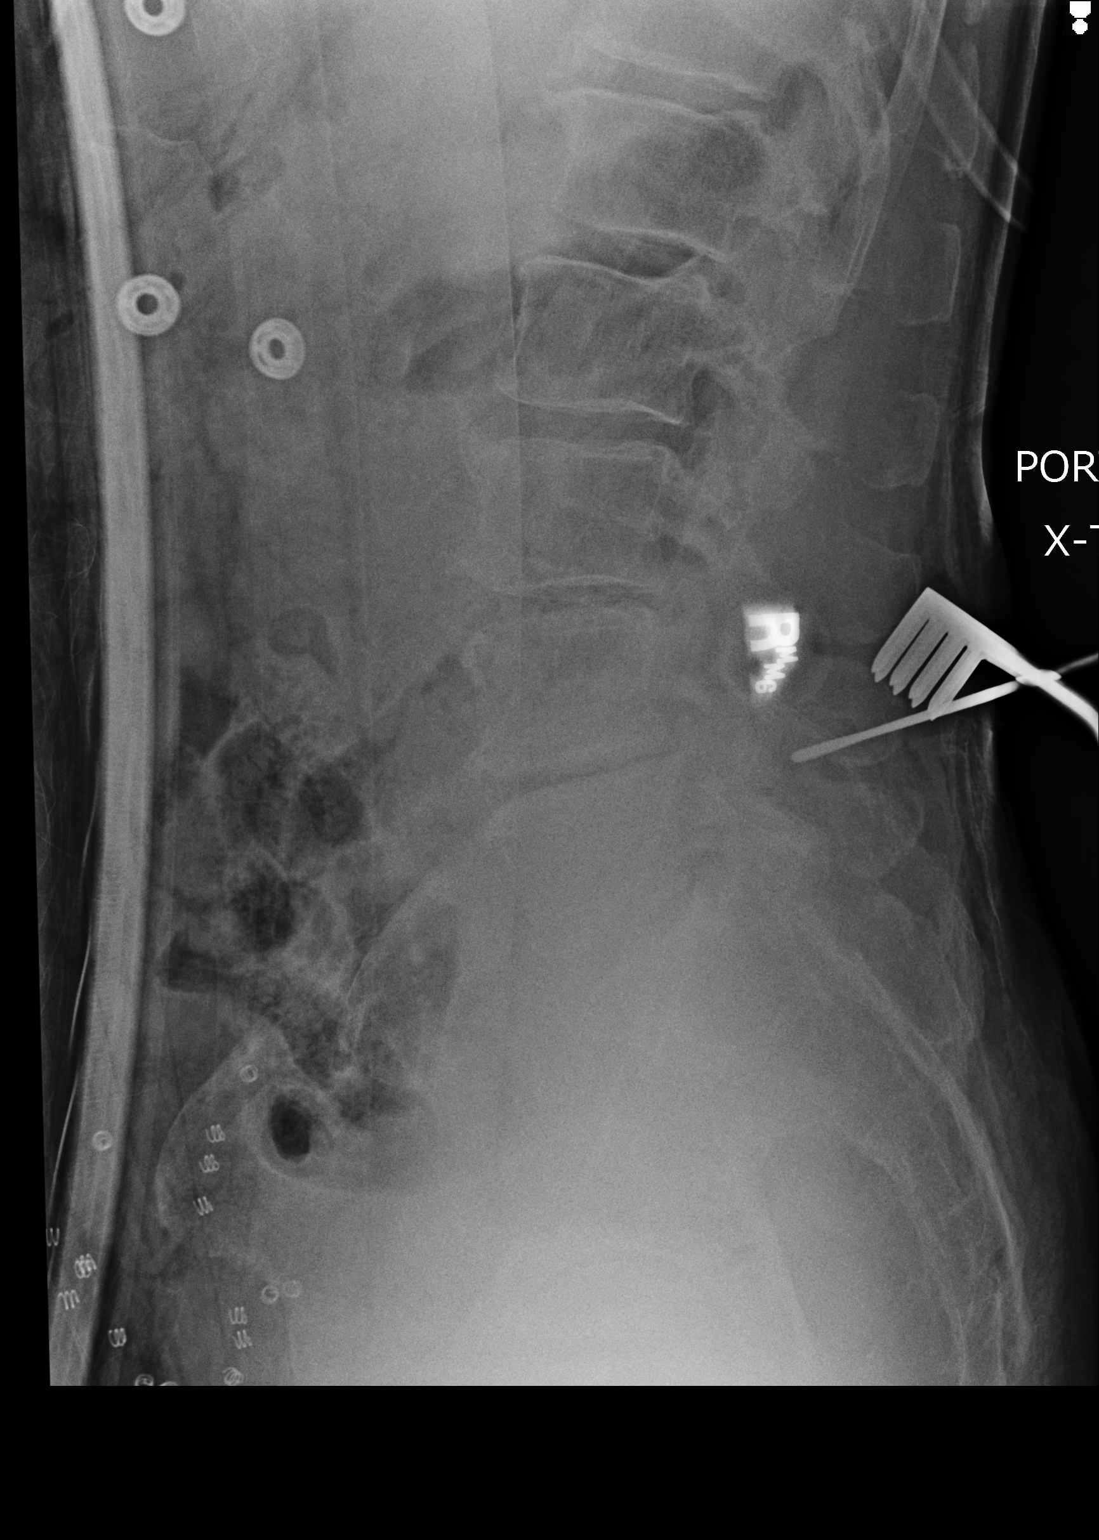

[1 of 1 positions shown; findings below may reference images not displayed]

FINDINGS: Skin retractors appreciated within the soft tissues of the lower
lumbar spine. A surgical probe projects along the inferior border of
the spinous process of L4. Degenerative disc disease changes are
appreciated at L4-5. No acute osseous abnormalities appreciated.
IMPRESSION: Intra op lumbar Spine landmark evaluation.

## 2015-07-14 DIAGNOSIS — N401 Enlarged prostate with lower urinary tract symptoms: Secondary | ICD-10-CM

## 2015-07-14 DIAGNOSIS — G2581 Restless legs syndrome: Secondary | ICD-10-CM | POA: Insufficient documentation

## 2015-07-14 DIAGNOSIS — N138 Other obstructive and reflux uropathy: Secondary | ICD-10-CM | POA: Insufficient documentation

## 2015-07-14 DIAGNOSIS — M5137 Other intervertebral disc degeneration, lumbosacral region: Secondary | ICD-10-CM | POA: Insufficient documentation

## 2015-10-27 DIAGNOSIS — Z23 Encounter for immunization: Secondary | ICD-10-CM | POA: Diagnosis not present

## 2015-11-24 DIAGNOSIS — Z23 Encounter for immunization: Secondary | ICD-10-CM | POA: Diagnosis not present

## 2015-11-29 DIAGNOSIS — L570 Actinic keratosis: Secondary | ICD-10-CM | POA: Diagnosis not present

## 2015-12-15 DIAGNOSIS — L570 Actinic keratosis: Secondary | ICD-10-CM | POA: Diagnosis not present

## 2016-01-12 DIAGNOSIS — L570 Actinic keratosis: Secondary | ICD-10-CM | POA: Diagnosis not present

## 2016-03-27 DIAGNOSIS — I4892 Unspecified atrial flutter: Secondary | ICD-10-CM | POA: Diagnosis not present

## 2016-03-27 DIAGNOSIS — I498 Other specified cardiac arrhythmias: Secondary | ICD-10-CM | POA: Diagnosis not present

## 2016-03-30 DIAGNOSIS — I4892 Unspecified atrial flutter: Secondary | ICD-10-CM | POA: Diagnosis not present

## 2016-04-03 ENCOUNTER — Other Ambulatory Visit: Payer: Self-pay | Admitting: Cardiology

## 2016-04-03 ENCOUNTER — Ambulatory Visit
Admission: RE | Admit: 2016-04-03 | Discharge: 2016-04-03 | Disposition: A | Discharge status: Home / Self Care | Payer: 59 | Source: Ambulatory Visit | Attending: Cardiology | Admitting: Cardiology

## 2016-04-03 DIAGNOSIS — I483 Typical atrial flutter: Secondary | ICD-10-CM | POA: Diagnosis not present

## 2016-04-03 DIAGNOSIS — I4891 Unspecified atrial fibrillation: Secondary | ICD-10-CM

## 2016-04-03 DIAGNOSIS — I517 Cardiomegaly: Secondary | ICD-10-CM | POA: Diagnosis not present

## 2016-04-03 LAB — ECHOCARDIOGRAM COMPLETE
AOPV: 0.97 m/s
AV Area VTI: 3.05 cm2
AV peak Index: 1.37
AV pk vel: 75.4 cm/s
AVPG: 2 mmHg
CHL CUP MV DEC (S): 164
E decel time: 164 msec
FS: 35 % (ref 28–44)
IV/PV OW: 0.83
LA ID, A-P, ES: 35 mm
LA vol A4C: 47.2 ml
LADIAMINDEX: 1.57 cm/m2
LAVOL: 50.8 mL
LAVOLIN: 22.8 mL/m2
LEFT ATRIUM END SYS DIAM: 35 mm
LV PW d: 12.1 mm — AB (ref 0.6–1.1)
LVOT area: 3.14 cm2
LVOT diameter: 20 mm
LVOTPV: 73.2 cm/s
MV Peak grad: 2 mmHg
MV pk E vel: 73.2 m/s
TAPSE: 15.7 mm

## 2016-04-03 NOTE — Progress Notes (Signed)
*  PRELIMINARY RESULTS* Echocardiogram 2D Echocardiogram has been performed.  Randall Shannon 04/03/2016, 11:29 AM

## 2016-04-04 ENCOUNTER — Ambulatory Visit (INDEPENDENT_AMBULATORY_CARE_PROVIDER_SITE_OTHER): Payer: 59 | Admitting: Internal Medicine

## 2016-04-04 ENCOUNTER — Encounter: Payer: Self-pay | Admitting: Internal Medicine

## 2016-04-04 ENCOUNTER — Encounter: Payer: Self-pay | Admitting: *Deleted

## 2016-04-04 VITALS — BP 112/70 | HR 70 | Ht 76.0 in | Wt 227.5 lb

## 2016-04-04 DIAGNOSIS — I4892 Unspecified atrial flutter: Secondary | ICD-10-CM | POA: Diagnosis not present

## 2016-04-04 NOTE — Progress Notes (Signed)
ELECTROPHYSIOLOGY CONSULT NOTE  Patient ID: Randall Shannon, MRN: [id number], DOB/AGE: 11/09/1955 60 y.o. Admit date: (Not on file) Date of Consult: 04/04/2016  Primary Physician: Idelle Crouch, MD Primary Cardiologist: [facility] Consulting Physician atrial Flutter atypical  Chief Complaint: atrial flutter   HPI Randall Shannon is a 60 y.o. male    Seen because of the recent development of atrial flutter.  He recalls the abrupt onset of palpitations 9 days ago. They were quite similar to palpitations he had a number of months ago when he felt that he was in [place].  However, he soon developed persistent tachycardia palpitations that were irregular. These were associated with profound fatigue and exercise intolerance. He was seen by Dr. Bunnie Pion his primary cardiologist next day and we will echocardiogram demonstrated irregular atypical atrial flutter versus coarse atrial fibrillation. Metoprolol was started. A Holter monitor was arranged. Anticoagulation was then initiated 4 days later following the Holter monitor. ECG again demonstrated a coarse A. fib versus atrial flutter.  He was started on flecainide  His symptoms were improved with a slower rate. He continues however with moderate exercise intolerance.  He snores. He does not have significant daytime somnolence.  CHADS-VASc score 0      Past Medical History  Diagnosis Date  . Atypical atrial flutter ([place])   . Sleep-disordered breathing       Surgical History:  Past Surgical History  Procedure Laterality Date  . Back surgery  2009  . Hernia repair Bilateral     2 left side, 1 on right inguinal  . Appendectomy    . Tonsillectomy    . Lumbar laminectomy/decompression microdiscectomy N/A 02/04/2014    Procedure: Lumbar Three-Four Laminectomy, Lumbar Two Laminotomy;  Surgeon: Ophelia Charter, MD;  Location: [facility];  Service: Neurosurgery;  Laterality: N/A;  Lumbar Three-Four Laminectomy, Lumbar Two Laminotomy      Home Meds: Prior to Admission medications   Medication Sig Start Date End Date Taking? Authorizing Provider  apixaban (ELIQUIS) 5 MG TABS tablet Take 5 mg by mouth 2 (two) times daily.   Yes Historical Provider, MD  flecainide (TAMBOCOR) 50 MG tablet Take 50 mg by mouth 2 (two) times daily.   Yes Historical Provider, MD  ibuprofen (ADVIL,MOTRIN) 800 MG tablet Take 800 mg by mouth every 8 (eight) hours as needed for moderate pain.   Yes Historical Provider, MD  metoprolol tartrate (LOPRESSOR) 25 MG tablet  03/27/16  Yes Historical Provider, MD  metoprolol tartrate (LOPRESSOR) 25 MG tablet Take 50 mg by mouth 2 (two) times daily.   Yes Historical Provider, MD  pramipexole (MIRAPEX) 1 MG tablet Take 1 mg by mouth 2 (two) times daily before a meal.   Yes Historical Provider, MD  RAPAFLO 8 MG CAPS capsule Take 8 mg by mouth daily with breakfast.  02/18/16  Yes Historical Provider, MD  tadalafil (CIALIS) 5 MG tablet Take 5 mg by mouth daily as needed for erectile dysfunction.   Yes Historical Provider, MD    Allergies:  Allergies  Allergen Reactions  . Oysters [Shellfish Allergy] Other (See Comments)    GI upset    Social History   Social History  . Marital Status: Married    Spouse Name: N/A  . Number of Children: N/A  . Years of Education: N/A   Occupational History  . Not on file.   Social History Main Topics  . Smoking status: Current Some Day Smoker    Types: Cigars  .  Smokeless tobacco: Never Used     Comment: cigar occasionally  . Alcohol Use: Yes     Comment: Red wine  . Drug Use: No  . Sexual Activity: Not on file   Other Topics Concern  . Not on file   Social History Narrative     Family History  Problem Relation Age of Onset  . AAA (abdominal aortic aneurysm) Mother   . Stroke Father   . Atrial fibrillation Father   . Atrial fibrillation Brother      ROS:  Please see the history of present illness.     All other systems reviewed and negative.     Physical Exam: Blood pressure 112/70, pulse 70, height 6\' 4"  (1.93 m), weight 227 lb 8 oz (103.193 kg). General: Well developed, well nourished male in no acute distress. Head: Normocephalic, atraumatic, sclera non-icteric, no xanthomas, nares are without discharge. EENT: normal  Lymph Nodes:  none Neck: Negative for carotid bruits. JVD 6-7 cm with flutter waves Back:without scoliosis kyphosis Lungs: Clear bilaterally to auscultation without wheezes, rales, or rhonchi. Breathing is unlabored. Heart: RRR with S1 S2. No  murmur . No rubs, or gallops appreciated. Abdomen: Soft, non-tender, non-distended with normoactive bowel sounds. No hepatomegaly. No rebound/guarding. No obvious abdominal masses. Msk:  Strength and tone appear normal for age. Extremities: No clubbing or cyanosis. No edema.  Distal pedal pulses are 2+ and equal bilaterally. Skin: Warm and Dry Neuro: Alert and oriented X 3. CN III-XII intact Grossly normal sensory and motor function . Psych:  Responds to questions appropriately with a normal affect.      Labs: Cardiac Enzymes No results for input(s): CKTOTAL, CKMB, TROPONINI in the last 72 hours. CBC Lab Results  Component Value Date   WBC 6.7 02/03/2014   HGB 16.2 02/03/2014   HCT 46.1 02/03/2014   MCV 96.4 02/03/2014   PLT 291 02/03/2014   PROTIME: No results for input(s): LABPROT, INR in the last 72 hours. Chemistry No results for input(s): NA, K, CL, CO2, BUN, CREATININE, CALCIUM, PROT, BILITOT, ALKPHOS, ALT, AST, GLUCOSE in the last 168 hours.  Invalid input(s): LABALBU Lipids No results found for: CHOL, HDL, LDLCALC, TRIG BNP No results found for: PROBNP Thyroid Function Tests: No results for input(s): TSH, T4TOTAL, T3FREE, THYROIDAB in the last 72 hours.  Invalid input(s): FREET3 Miscellaneous No results found for: DDIMER  Radiology/Studies:  No results found.  EKG: Atypical flutter with negative flutter waves inferiorly and negative  flutter waves in lead V1   Assessment and Plan:  Atypical flutter question reverse typical  Sleep disordered breathing   The patient has persistent symptoms of exercise intolerance and atypical atrial flutter. Without a prior history of instrumentation it is possible that this represents a reverse typical flutter. We have reviewed this possibility and have chosen to proceed with TEE guided EP testing and cardioversion and/or flutter ablation depending on the findings at EP testing. We reviewed the potential benefits and risks including but not limited to bleeding perforation and heart block requiring pacing  He will continue his apixoban. We'll discontinue his flecainide but maintaining his metoprolol for right now.  We will need an outpatient sleep study     Virl Axe  Addendum  Echo describes RA and RV enlargement- albeit mild.  This raises issue of L>>R shunting and will do MRI-- this might explain the source of the atypical flutter

## 2016-04-04 NOTE — Patient Instructions (Addendum)
Medication Instructions: - Your physician has recommended you make the following change in your medication:  1) Stop flecainide  Labwork: - Your physician recommends that you have lab work today: BMP/ CBC  Procedures/Testing: - Your physician has requested that you have a TEE. During a TEE, sound waves are used to create images of your heart. It provides your doctor with information about the size and shape of your heart and how well your heart's chambers and valves are working. In this test, a transducer is attached to the end of a flexible tube that's guided down your throat and into your esophagus (the tube leading from you mouth to your stomach) to get a more detailed image of your heart. You are not awake for the procedure. Please see the instruction sheet given to you today. For further information please visit [email or web address].  - Your physician has recommended that you have an ablation. Catheter ablation is a medical procedure used to treat some cardiac arrhythmias (irregular heartbeats). During catheter ablation, a long, thin, flexible tube is put into a blood vessel in your groin (upper thigh), or neck. This tube is called an ablation catheter. It is then guided to your heart through the blood vessel. Radio frequency waves destroy small areas of heart tissue where abnormal heartbeats may cause an arrhythmia to start. Please see the instruction sheet given to you today.   Follow-Up: - Your physician recommends that you schedule a follow-up appointment in: 4-5 weeks with Dr. Caryl Comes   Any Additional Special Instructions Will Be Listed Below (If Applicable).     If you need a refill on your cardiac medications before your next appointment, please call your pharmacy.

## 2016-04-05 LAB — CBC WITH DIFFERENTIAL/PLATELET
BASOS ABS: 0 10*3/uL (ref 0.0–0.2)
Basos: 1 %
EOS (ABSOLUTE): 0.2 10*3/uL (ref 0.0–0.4)
Eos: 5 %
Hematocrit: 45.9 % (ref 37.5–51.0)
Hemoglobin: 15.4 g/dL (ref 12.6–17.7)
IMMATURE GRANS (ABS): 0 10*3/uL (ref 0.0–0.1)
IMMATURE GRANULOCYTES: 0 %
LYMPHS: 34 %
Lymphocytes Absolute: 1.4 10*3/uL (ref 0.7–3.1)
MCH: 33.6 pg — ABNORMAL HIGH (ref 26.6–33.0)
MCHC: 33.6 g/dL (ref 31.5–35.7)
MCV: 100 fL — ABNORMAL HIGH (ref 79–97)
Monocytes Absolute: 0.3 10*3/uL (ref 0.1–0.9)
Monocytes: 8 %
Neutrophils Absolute: 2.2 10*3/uL (ref 1.4–7.0)
Neutrophils: 52 %
PLATELETS: 289 10*3/uL (ref 150–379)
RBC: 4.59 x10E6/uL (ref 4.14–5.80)
RDW: 13 % (ref 12.3–15.4)
WBC: 4.2 10*3/uL (ref 3.4–10.8)

## 2016-04-05 LAB — BASIC METABOLIC PANEL
BUN / CREAT RATIO: 15 (ref 10–24)
BUN: 17 mg/dL (ref 8–27)
CHLORIDE: 99 mmol/L (ref 96–106)
CO2: 21 mmol/L (ref 18–29)
Calcium: 8.8 mg/dL (ref 8.6–10.2)
Creatinine, Ser: 1.15 mg/dL (ref 0.76–1.27)
GFR calc Af Amer: 79 mL/min/{1.73_m2} (ref >59)
GFR calc non Af Amer: 68 mL/min/{1.73_m2} (ref >59)
GLUCOSE: 91 mg/dL (ref 65–99)
Potassium: 5.1 mmol/L (ref 3.5–5.2)
Sodium: 140 mmol/L (ref 134–144)

## 2016-04-07 ENCOUNTER — Ambulatory Visit (HOSPITAL_BASED_OUTPATIENT_CLINIC_OR_DEPARTMENT_OTHER): Payer: 59

## 2016-04-07 ENCOUNTER — Ambulatory Visit (HOSPITAL_COMMUNITY): Payer: 59 | Admitting: Certified Registered Nurse Anesthetist

## 2016-04-07 ENCOUNTER — Encounter (HOSPITAL_COMMUNITY): Payer: Self-pay | Admitting: Certified Registered Nurse Anesthetist

## 2016-04-07 ENCOUNTER — Encounter (HOSPITAL_COMMUNITY)
Admission: RE | Disposition: A | Discharge status: Home / Self Care | Payer: Self-pay | Source: Ambulatory Visit | Attending: Internal Medicine

## 2016-04-07 ENCOUNTER — Ambulatory Visit (HOSPITAL_COMMUNITY)
Admission: RE | Admit: 2016-04-07 | Discharge: 2016-04-08 | Disposition: A | Discharge status: Home / Self Care | Payer: 59 | Source: Ambulatory Visit | Attending: Internal Medicine | Admitting: Internal Medicine

## 2016-04-07 DIAGNOSIS — G4730 Sleep apnea, unspecified: Secondary | ICD-10-CM | POA: Insufficient documentation

## 2016-04-07 DIAGNOSIS — Z79899 Other long term (current) drug therapy: Secondary | ICD-10-CM | POA: Insufficient documentation

## 2016-04-07 DIAGNOSIS — I4891 Unspecified atrial fibrillation: Secondary | ICD-10-CM | POA: Diagnosis not present

## 2016-04-07 DIAGNOSIS — F17290 Nicotine dependence, other tobacco product, uncomplicated: Secondary | ICD-10-CM | POA: Insufficient documentation

## 2016-04-07 DIAGNOSIS — I340 Nonrheumatic mitral (valve) insufficiency: Secondary | ICD-10-CM

## 2016-04-07 DIAGNOSIS — I483 Typical atrial flutter: Secondary | ICD-10-CM | POA: Insufficient documentation

## 2016-04-07 DIAGNOSIS — Z7901 Long term (current) use of anticoagulants: Secondary | ICD-10-CM | POA: Insufficient documentation

## 2016-04-07 DIAGNOSIS — I4892 Unspecified atrial flutter: Secondary | ICD-10-CM | POA: Diagnosis not present

## 2016-04-07 DIAGNOSIS — I484 Atypical atrial flutter: Secondary | ICD-10-CM

## 2016-04-07 HISTORY — PX: ELECTROPHYSIOLOGIC STUDY: SHX172A

## 2016-04-07 LAB — ECHO TEE: Ao-asc: 32 cm

## 2016-04-07 SURGERY — A-FLUTTER/A-TACH/SVT ABLATION
Anesthesia: General

## 2016-04-07 MED ORDER — SODIUM CHLORIDE 0.9% FLUSH: 3.0000 mL | INTRAVENOUS | Status: DC | PRN

## 2016-04-07 MED ORDER — ONDANSETRON HCL 4 MG/2ML IJ SOLN: 4.0000 mg | Freq: Four times a day (QID) | INTRAMUSCULAR | Status: DC | PRN

## 2016-04-07 MED ORDER — HEPARIN (PORCINE) IN NACL 2-0.9 UNIT/ML-% IJ SOLN
INTRAMUSCULAR | Status: AC
  Filled 2016-04-07: qty 500

## 2016-04-07 MED ORDER — TADALAFIL 5 MG PO TABS
5.0000 mg | ORAL_TABLET | Freq: Every day | ORAL | Status: DC
  Filled 2016-04-07: qty 1

## 2016-04-07 MED ORDER — ROCURONIUM BROMIDE 100 MG/10ML IV SOLN
INTRAVENOUS | Status: DC | PRN
  Administered 2016-04-07: 50 mg via INTRAVENOUS

## 2016-04-07 MED ORDER — FENTANYL CITRATE (PF) 100 MCG/2ML IJ SOLN
INTRAMUSCULAR | Status: DC | PRN
  Administered 2016-04-07: 50 ug via INTRAVENOUS

## 2016-04-07 MED ORDER — PHENYLEPHRINE HCL 10 MG/ML IJ SOLN
INTRAMUSCULAR | Status: DC | PRN
  Administered 2016-04-07 (×2): 40 ug via INTRAVENOUS
  Administered 2016-04-07: 80 ug via INTRAVENOUS

## 2016-04-07 MED ORDER — HEPARIN (PORCINE) IN NACL 2-0.9 UNIT/ML-% IJ SOLN
INTRAMUSCULAR | Status: DC | PRN
  Administered 2016-04-07: 09:00:00

## 2016-04-07 MED ORDER — SODIUM CHLORIDE 0.9 % IV SOLN: 250.0000 mL | INTRAVENOUS | Status: DC | PRN

## 2016-04-07 MED ORDER — CLONAZEPAM 0.5 MG PO TABS
1.0000 mg | ORAL_TABLET | Freq: Every evening | ORAL | Status: DC | PRN
  Administered 2016-04-07: 1 mg via ORAL
  Filled 2016-04-07: qty 2

## 2016-04-07 MED ORDER — RAMIPRIL 2.5 MG PO CAPS: 2.5000 mg | ORAL_CAPSULE | Freq: Every day | ORAL | Status: DC

## 2016-04-07 MED ORDER — PROPOFOL 10 MG/ML IV BOLUS
INTRAVENOUS | Status: DC | PRN
  Administered 2016-04-07: 150 mg via INTRAVENOUS

## 2016-04-07 MED ORDER — PNEUMOCOCCAL VAC POLYVALENT 25 MCG/0.5ML IJ INJ
0.5000 mL | INJECTION | INTRAMUSCULAR | Status: DC
  Filled 2016-04-07: qty 0.5

## 2016-04-07 MED ORDER — BUPIVACAINE HCL (PF) 0.25 % IJ SOLN
INTRAMUSCULAR | Status: AC
  Filled 2016-04-07: qty 30

## 2016-04-07 MED ORDER — SODIUM CHLORIDE 0.9 % IV SOLN
INTRAVENOUS | Status: DC | PRN
  Administered 2016-04-07 (×2): via INTRAVENOUS

## 2016-04-07 MED ORDER — TAMSULOSIN HCL 0.4 MG PO CAPS
0.4000 mg | ORAL_CAPSULE | Freq: Every day | ORAL | Status: DC
  Administered 2016-04-08: 0.4 mg via ORAL
  Filled 2016-04-07: qty 1

## 2016-04-07 MED ORDER — BUPIVACAINE HCL (PF) 0.25 % IJ SOLN
INTRAMUSCULAR | Status: DC | PRN
  Administered 2016-04-07: 20 mL

## 2016-04-07 MED ORDER — MIDAZOLAM HCL 5 MG/5ML IJ SOLN
INTRAMUSCULAR | Status: DC | PRN
  Administered 2016-04-07: 2 mg via INTRAVENOUS

## 2016-04-07 MED ORDER — EPHEDRINE SULFATE 50 MG/ML IJ SOLN
INTRAMUSCULAR | Status: DC | PRN
  Administered 2016-04-07: 10 mg via INTRAVENOUS
  Administered 2016-04-07: 5 mg via INTRAVENOUS

## 2016-04-07 MED ORDER — SUGAMMADEX SODIUM 200 MG/2ML IV SOLN
INTRAVENOUS | Status: DC | PRN
  Administered 2016-04-07: 200 mg via INTRAVENOUS

## 2016-04-07 MED ORDER — ONDANSETRON HCL 4 MG/2ML IJ SOLN
INTRAMUSCULAR | Status: DC | PRN
  Administered 2016-04-07: 4 mg via INTRAVENOUS

## 2016-04-07 MED ORDER — LIDOCAINE HCL (CARDIAC) 20 MG/ML IV SOLN
INTRAVENOUS | Status: DC | PRN
  Administered 2016-04-07: 80 mg via INTRAVENOUS

## 2016-04-07 MED ORDER — METOPROLOL TARTRATE 50 MG PO TABS
50.0000 mg | ORAL_TABLET | Freq: Two times a day (BID) | ORAL | Status: DC
Start: 2016-04-07 — End: 2016-04-08
  Filled 2016-04-07: qty 1

## 2016-04-07 MED ORDER — SODIUM CHLORIDE 0.9 % IV SOLN
INTRAVENOUS | Status: AC
  Filled 2016-04-07: qty 250

## 2016-04-07 MED ORDER — PHENOL 1.4 % MT LIQD
1.0000 | OROMUCOSAL | Status: DC | PRN
  Filled 2016-04-07: qty 177

## 2016-04-07 MED ORDER — PRAMIPEXOLE DIHYDROCHLORIDE 1 MG PO TABS
1.0000 mg | ORAL_TABLET | Freq: Two times a day (BID) | ORAL | Status: DC
  Administered 2016-04-07 – 2016-04-08 (×2): 1 mg via ORAL
  Filled 2016-04-07 (×2): qty 1

## 2016-04-07 MED ORDER — SODIUM CHLORIDE 0.9% FLUSH
3.0000 mL | Freq: Two times a day (BID) | INTRAVENOUS | Status: DC
  Administered 2016-04-07 (×2): 3 mL via INTRAVENOUS

## 2016-04-07 MED ORDER — ACETAMINOPHEN 325 MG PO TABS
650.0000 mg | ORAL_TABLET | ORAL | Status: DC | PRN
  Administered 2016-04-07: 650 mg via ORAL
  Filled 2016-04-07: qty 2

## 2016-04-07 MED ORDER — APIXABAN 5 MG PO TABS
5.0000 mg | ORAL_TABLET | Freq: Two times a day (BID) | ORAL | Status: DC
  Administered 2016-04-07: 5 mg via ORAL
  Filled 2016-04-07: qty 1

## 2016-04-07 SURGICAL SUPPLY — 11 items
BAG SNAP BAND KOVER 36X36 (MISCELLANEOUS) ×2 IMPLANT
CATH BLAZERPRIME XP LG CV 10MM (ABLATOR) ×2 IMPLANT
CATH DUODECA/ISMUS 7FR REPROC (CATHETERS) ×2 IMPLANT
CATH QUAD COURNAND 5FR (CATHETERS) ×2 IMPLANT
PACK EP LATEX FREE (CUSTOM PROCEDURE TRAY) ×1
PACK EP LF (CUSTOM PROCEDURE TRAY) ×1 IMPLANT
PAD DEFIB LIFELINK (PAD) ×2 IMPLANT
SHEATH ATRIAL FLUTTER SAFL 8F (SHEATH) ×2 IMPLANT
SHEATH PINNACLE 7F 10CM (SHEATH) ×2 IMPLANT
SHEATH PINNACLE 8F 10CM (SHEATH) ×4 IMPLANT
SHIELD RADPAD SCOOP 12X17 (MISCELLANEOUS) ×2 IMPLANT

## 2016-04-07 NOTE — Anesthesia Preprocedure Evaluation (Addendum)
Anesthesia Evaluation  Patient identified by MRN, date of birth, ID band Patient awake    Airway Mallampati: II  TM Distance: >3 FB Neck ROM: Full    Dental   Pulmonary Current Smoker,    breath sounds clear to auscultation       Cardiovascular negative cardio ROS  + dysrhythmias Atrial Fibrillation  Rhythm:Regular Rate:Normal     Neuro/Psych    GI/Hepatic negative GI ROS, Neg liver ROS,   Endo/Other  negative endocrine ROS  Renal/GU negative Renal ROS     Musculoskeletal   Abdominal   Peds  Hematology   Anesthesia Other Findings   Reproductive/Obstetrics                           Anesthesia Physical Anesthesia Plan  ASA: III  Anesthesia Plan: General   Post-op Pain Management:    Induction: Intravenous  Airway Management Planned: Oral ETT  Additional Equipment:   Intra-op Plan:   Post-operative Plan: Possible Post-op intubation/ventilation  Informed Consent: I have reviewed the patients History and Physical, chart, labs and discussed the procedure including the risks, benefits and alternatives for the proposed anesthesia with the patient or authorized representative who has indicated his/her understanding and acceptance.   Dental advisory given  Plan Discussed with: CRNA and Anesthesiologist  Anesthesia Plan Comments:        Anesthesia Quick Evaluation

## 2016-04-07 NOTE — Progress Notes (Signed)
Site area: left groin Site Prior to Removal:  Level   0 Pressure Applied For:  15 minutes Manual:   yes Patient Status During Pull:  stable Post Pull Site:  Level  0 Post Pull Instructions Given:  yes Post Pull Pulses Present: yes Dressing Applied:  Small tegaderm Bedrest begins @   1100 Comments:  IV saline locked

## 2016-04-07 NOTE — Op Note (Signed)
NAMECHASKE, ORNDORFF NO.:  [id number]  MEDICAL RECORD NO.:  [id number]  LOCATION:  [facility]                         FACILITY:  [facility]  PHYSICIAN:  Deboraha Sprang, MD, FACCDATE OF BIRTH:  13-Jun-1956  DATE OF PROCEDURE:  04/07/2016 DATE OF DISCHARGE:                              OPERATIVE REPORT   PREOPERATIVE DIAGNOSIS:  Atypical atrial flutter.  POSTOPERATIVE DIAGNOSIS:  Reverse typical atrial flutter.  PROCEDURE:  Invasive electrophysiological study with catheter ablation.  PROCEDURE IN DETAIL:  Following obtaining informed consent, the patient was brought to the electrophysiology laboratory and placed on the fluoroscopic table under the care of Dr. Orene Desanctis and Dr. Oletta Lamas.  The patient was submitted for general anesthesia.  A TEE performed by Dr. Debara Pickett under separate report demonstrated no left atrial thrombus.  Catheterization was then performed with adjunctive local anesthesia. Following the procedure, the catheters were removed.  The sheaths were left in place and the patient was transferred to the holding area for sheath removal in stable condition.  CATHETERS: 1. A 5-French quadripolar catheter was inserted via the left femoral     vein to the AV junction. 2. A 7-French dual decapolar catheter was inserted via the left     femoral vein to the tricuspid anulus and the coronary sinus. 3. An 8-French 10-mm tip ablation catheter was inserted via the right     femoral vein using SAFL sheath to mapping sites in the posterior     septal space. 4. Surface leads 1, aVF, and V1 were monitored continuously throughout     the procedure.  Following insertion of the catheters, a stimulation     protocol included entrainment mapping from the tricuspid anulus in     the left atrium via the CS. 5. Single atrial extrastimuli. 6. Burst ventricular pacing.  END-TIDAL RESULTS:  End-tidal Surface Electrocardiogram With Basic Intervals: 1. Rhythm:  Atrial flutter; AA  interval 191 milliseconds; RR interval     833 milliseconds; QRS duration 94 milliseconds; QT interval N/M; AH     interval N/A; HV interval 61 milliseconds.  1. Final rhythm:  Sinus; RR interval of 1161 milliseconds; PR interval     165 milliseconds; P-wave duration 120 milliseconds; QRS duration 98     milliseconds; QT interval 456 millisecond; AH interval 960     milliseconds; HV interval 59 milliseconds.  AV NODAL FUNCTION: 1. AV Wenckebach was not determined.  VA conduction was dissociated at     600 milliseconds.  1. AV nodal effective refractory period at 700 milliseconds was 420     milliseconds and at 600 milliseconds was longer than 440     milliseconds.  SENSORY PATHWAY FUNCTION:  No evidence of accessory pathway was identified.  Ventricular response programmed stimulation was normal for ventricular stimulation as described.  ARRHYTHMIAS INDUCED:  The patient presented to the lab with atypical atrial flutter.  Isthmus mapping demonstrating isthmus dependent conduction.  Catheter ablation across the cavotricuspid isthmus terminated the atrial flutter and resulted in bidirectional cavotricuspid isthmus conduction block.  FLUOROSCOPY TIME:  A total 6.6 minutes of fluoroscopy time was utilized.  RF ENERGY:  A total of 4.3 minutes of RF  energy was applied across the cavotricuspid isthmus.  IMPRESSION: 1. Normal sinus function. 2. Normal AV nodal function. 3. Abnormal atrial function with sustained atypical-reverse typical     atrial flutter with successful ablation and interruption of     cavotricuspid isthmus conduction with bidirectional block. 4. Normal His-Purkinje system function. 5. No accessory pathway. 6. Normal ventricular response programmed stimulation.  In conclusion, the results of electrophysiological testing confirmed cavotricuspid isthmus dependent reverse typical atrial flutter. Catheter ablation successfully terminated the rhythm and resulted  in bidirectional isthmus block.  The patient will be observed overnight. He will be maintained on his Eliquis for 4 weeks.  The patient tolerated the procedure without apparent complication.     Deboraha Sprang, MD, [facility]     SCK/MEDQ  D:  04/07/2016  T:  04/07/2016  Job:  [id number]

## 2016-04-07 NOTE — Interval H&P Note (Signed)
History and Physical Interval Note:  04/07/2016 7:49 AM  Randall Shannon  has presented today for surgery, with the diagnosis of flutter  The various methods of treatment have been discussed with the patient and family. After consideration of risks, benefits and other options for treatment, the patient has consented to  Procedure(s): A-Flutter Ablation (N/A) as a surgical intervention .  [name] history has been reviewed, patient examined, no change in status, stable for surgery.  I have reviewed the patient's chart and labs.  Questions were answered to the patient's satisfaction.     Virl Axe

## 2016-04-07 NOTE — Transfer of Care (Signed)
Immediate Anesthesia Transfer of Care Note  Patient: Randall Shannon  Procedure(s) Performed: Procedure(s): A-Flutter Ablation (N/A)  Patient Location: Cath Lab  Anesthesia Type:General  Level of Consciousness: awake, alert  and oriented  Airway & Oxygen Therapy: Patient Spontanous Breathing and Patient connected to nasal cannula oxygen  Post-op Assessment: Report given to RN and Post -op Vital signs reviewed and stable  Post vital signs: Reviewed and stable  Last Vitals:  Filed Vitals:   04/07/16 0532  BP: 131/80  Pulse: 82  Temp: 36.6 C  Resp: 18    Last Pain: There were no vitals filed for this visit.       Complications: No apparent anesthesia complications

## 2016-04-07 NOTE — Progress Notes (Signed)
Site area: rt groin Site Prior to Removal:  Level  0 Pressure Applied For:  25 minutes Manual:   yes Patient Status During Pull:  stable Post Pull Site:  Level  0 Post Pull Instructions Given:  yes Post Pull Pulses Present: yes Dressing Applied:  Small tegaderm Bedrest begins @  Comments:

## 2016-04-07 NOTE — Discharge Summary (Signed)
ELECTROPHYSIOLOGY PROCEDURE DISCHARGE SUMMARY    Patient ID: Randall Shannon,  MRN: [id number], DOB/AGE: 62-28-1957 62 y.o.  Admit date: 04/07/2016 Discharge date: 04/08/16  Primary Care Physician: Idelle Crouch, MD  Electrophysiologist: Dr. Caryl Comes  Primary Discharge Diagnosis:   1. Atrial flutter       status post ablation this admission      CHA2DS2Vasc is zero, on Eliquis per-procedure      Flecainide with Metoprolol  Secondary Discharge Diagnosis:  none  Allergies  Allergen Reactions  . Oysters [Shellfish Allergy] Other (See Comments)    GI upset     Procedures This Admission: 1.  Electrophysiology study and radiofrequency catheter ablation on 04/07/16 by Dr Caryl Comes.   This study demonstrated  IMPRESSION: 1. Normal sinus function. 2. Normal AV nodal function. 3. Abnormal atrial function with sustained atypical-reverse typical  atrial flutter with successful ablation and interruption of  cavotricuspid isthmus conduction with bidirectional block. 4. Normal His-Purkinje system function. 5. No accessory pathway. 6. Normal ventricular response programmed stimulation. In conclusion, the results of electrophysiological testing confirmed cavotricuspid isthmus dependent reverse typical atrial flutter. Catheter ablation successfully terminated the rhythm and resulted in bidirectional isthmus block. The patient will be observed overnight. He will be maintained on his Eliquis for 4 weeks.  2. 04/07/16: TEE, Dr. Debara Pickett IMPRESSION:  1. No LAA thrombus 2. Negative for PFO or ASD 3. Mild right heart enlargement 4. LVEF 55-60%, normal wall motion RECOMMENDATIONS:  Ok to proceed with atrial flutter ablation. No evidence for intracardiac shunting or anomalous venous return to explain right heart enlargement.   Brief HPI: Randall Shannon is a 60 y.o. male with a past medical history as outlined above. He has documented atrial flutter. He underwent TEE pre-procedure as  noted above.  Risks, benefits, and alternatives to ablation were reviewed with the patient who wished to proceed.   Hospital Course:  The patient was admitted and underwent EPS/RFCA with details as outlined above. He was monitored on telemetry overnight which demonstrated sinus rhythm.  Groin was without complication.  They were examined by Dr Rayann Heman who considered them stable for discharge to home.  Follow up has been arranged with Dr. Caryl Comes.   Wound care and restrictions were reviewed with the patient prior to discharge.   Physical Exam: Filed Vitals:   04/07/16 1533 04/07/16 1633 04/07/16 2000 04/08/16 0500  BP: 105/65 93/60 100/56 110/72  Pulse:    86  Temp:   97.6 F (36.4 C) 99.7 F (37.6 C)  TempSrc:   Oral   Resp: 22 12 20 20   Height:      Weight:    228 lb 12.8 oz (103.783 kg)  SpO2: 100% 97% 98% 98%    GEN- The patient is well appearing, alert and oriented x 3 today.   HEENT: normocephalic, atraumatic; sclera clear, conjunctiva pink; hearing intact; oropharynx clear; neck supple, no JVP Lymph- no cervical lymphadenopathy Lungs- Clear to ausculation bilaterally, normal work of breathing.  No wheezes, rales, rhonchi Heart- Regular rate and rhythm, no murmurs, rubs or gallops, PMI not laterally displaced GI- soft, non-tender, non-distended, bowel sounds present, no hepatosplenomegaly Extremities- no clubbing, cyanosis, or edema; DP/PT/radial pulses 2+ bilaterally, groin without hematoma/bruit MS- no significant deformity or atrophy Skin- warm and dry, no rash or lesion Psych- euthymic mood, full affect Neuro- strength and sensation are intact   Labs:   Lab Results  Component Value Date   WBC 4.2 04/04/2016   HGB 16.2 02/03/2014  HCT 45.9 04/04/2016   MCV 100* 04/04/2016   PLT 289 04/04/2016     Recent Labs Lab 04/04/16 1118  NA 140  K 5.1  CL 99  CO2 21  BUN 17  CREATININE 1.15  CALCIUM 8.8  GLUCOSE 91    Discharge Medications:    Medication List      STOP taking these medications        metoprolol tartrate 25 MG tablet  Commonly known as:  LOPRESSOR      TAKE these medications        CIALIS 5 MG tablet  Generic drug:  tadalafil  Take 5 mg by mouth daily.     clonazePAM 1 MG tablet  Commonly known as:  KLONOPIN  Take 1 mg by mouth at bedtime as needed for anxiety.     ELIQUIS 5 MG Tabs tablet  Generic drug:  apixaban  Take 5 mg by mouth 2 (two) times daily.     pramipexole 1 MG tablet  Commonly known as:  MIRAPEX  Take 1 mg by mouth 2 (two) times daily with a meal.     RAPAFLO 8 MG Caps capsule  Generic drug:  silodosin  Take 8 mg by mouth daily with breakfast.        Disposition:  Home   Follow-up Information    Follow up with Virl Axe, MD On 05/25/2016.   Specialty:  Cardiology   Why:  11:30AM   Contact information:   [place] [id number] [phone]       Duration of Discharge Encounter: Greater than 30 minutes including physician time.  Army Fossa MD  04/08/2016 9:12 AM

## 2016-04-07 NOTE — Anesthesia Postprocedure Evaluation (Signed)
Anesthesia Post Note  Patient: Randall Shannon  Procedure(s) Performed: Procedure(s) (LRB): A-Flutter Ablation (N/A)  Patient location during evaluation: PACU Anesthesia Type: General Level of consciousness: awake Pain management: pain level controlled Respiratory status: spontaneous breathing Cardiovascular status: stable Anesthetic complications: no    Last Vitals:  Filed Vitals:   04/07/16 1100 04/07/16 1129  BP: 108/61 94/68  Pulse: 69   Temp:  36.4 C  Resp: 16 17    Last Pain: There were no vitals filed for this visit.               EDWARDS,Madisen Ludvigsen

## 2016-04-07 NOTE — H&P (View-Only) (Signed)
ELECTROPHYSIOLOGY CONSULT NOTE  Patient ID: Randall Shannon, MRN: [id number], DOB/AGE: Feb 01, 1956 60 y.o. Admit date: (Not on file) Date of Consult: 04/04/2016  Primary Physician: Idelle Crouch, MD Primary Cardiologist: [facility] Consulting Physician atrial Flutter atypical  Chief Complaint: atrial flutter   HPI Randall Shannon is a 60 y.o. male    Seen because of the recent development of atrial flutter.  He recalls the abrupt onset of palpitations 9 days ago. They were quite similar to palpitations he had a number of months ago when he felt that he was in [place].  However, he soon developed persistent tachycardia palpitations that were irregular. These were associated with profound fatigue and exercise intolerance. He was seen by Dr. Bunnie Pion his primary cardiologist next day and we will echocardiogram demonstrated irregular atypical atrial flutter versus coarse atrial fibrillation. Metoprolol was started. A Holter monitor was arranged. Anticoagulation was then initiated 4 days later following the Holter monitor. ECG again demonstrated a coarse A. fib versus atrial flutter.  He was started on flecainide  His symptoms were improved with a slower rate. He continues however with moderate exercise intolerance.  He snores. He does not have significant daytime somnolence.  CHADS-VASc score 0      Past Medical History  Diagnosis Date  . Atypical atrial flutter ([place])   . Sleep-disordered breathing       Surgical History:  Past Surgical History  Procedure Laterality Date  . Back surgery  2009  . Hernia repair Bilateral     2 left side, 1 on right inguinal  . Appendectomy    . Tonsillectomy    . Lumbar laminectomy/decompression microdiscectomy N/A 02/04/2014    Procedure: Lumbar Three-Four Laminectomy, Lumbar Two Laminotomy;  Surgeon: Ophelia Charter, MD;  Location: [facility];  Service: Neurosurgery;  Laterality: N/A;  Lumbar Three-Four Laminectomy, Lumbar Two Laminotomy      Home Meds: Prior to Admission medications   Medication Sig Start Date End Date Taking? Authorizing Provider  apixaban (ELIQUIS) 5 MG TABS tablet Take 5 mg by mouth 2 (two) times daily.   Yes Historical Provider, MD  flecainide (TAMBOCOR) 50 MG tablet Take 50 mg by mouth 2 (two) times daily.   Yes Historical Provider, MD  ibuprofen (ADVIL,MOTRIN) 800 MG tablet Take 800 mg by mouth every 8 (eight) hours as needed for moderate pain.   Yes Historical Provider, MD  metoprolol tartrate (LOPRESSOR) 25 MG tablet  03/27/16  Yes Historical Provider, MD  metoprolol tartrate (LOPRESSOR) 25 MG tablet Take 50 mg by mouth 2 (two) times daily.   Yes Historical Provider, MD  pramipexole (MIRAPEX) 1 MG tablet Take 1 mg by mouth 2 (two) times daily before a meal.   Yes Historical Provider, MD  RAPAFLO 8 MG CAPS capsule Take 8 mg by mouth daily with breakfast.  02/18/16  Yes Historical Provider, MD  tadalafil (CIALIS) 5 MG tablet Take 5 mg by mouth daily as needed for erectile dysfunction.   Yes Historical Provider, MD    Allergies:  Allergies  Allergen Reactions  . Oysters [Shellfish Allergy] Other (See Comments)    GI upset    Social History   Social History  . Marital Status: Married    Spouse Name: N/A  . Number of Children: N/A  . Years of Education: N/A   Occupational History  . Not on file.   Social History Main Topics  . Smoking status: Current Some Day Smoker    Types: Cigars  .  Smokeless tobacco: Never Used     Comment: cigar occasionally  . Alcohol Use: Yes     Comment: Red wine  . Drug Use: No  . Sexual Activity: Not on file   Other Topics Concern  . Not on file   Social History Narrative     Family History  Problem Relation Age of Onset  . AAA (abdominal aortic aneurysm) Mother   . Stroke Father   . Atrial fibrillation Father   . Atrial fibrillation Brother      ROS:  Please see the history of present illness.     All other systems reviewed and negative.     Physical Exam: Blood pressure 112/70, pulse 70, height 6\' 4"  (1.93 m), weight 227 lb 8 oz (103.193 kg). General: Well developed, well nourished male in no acute distress. Head: Normocephalic, atraumatic, sclera non-icteric, no xanthomas, nares are without discharge. EENT: normal  Lymph Nodes:  none Neck: Negative for carotid bruits. JVD 6-7 cm with flutter waves Back:without scoliosis kyphosis Lungs: Clear bilaterally to auscultation without wheezes, rales, or rhonchi. Breathing is unlabored. Heart: RRR with S1 S2. No  murmur . No rubs, or gallops appreciated. Abdomen: Soft, non-tender, non-distended with normoactive bowel sounds. No hepatomegaly. No rebound/guarding. No obvious abdominal masses. Msk:  Strength and tone appear normal for age. Extremities: No clubbing or cyanosis. No edema.  Distal pedal pulses are 2+ and equal bilaterally. Skin: Warm and Dry Neuro: Alert and oriented X 3. CN III-XII intact Grossly normal sensory and motor function . Psych:  Responds to questions appropriately with a normal affect.      Labs: Cardiac Enzymes No results for input(s): CKTOTAL, CKMB, TROPONINI in the last 72 hours. CBC Lab Results  Component Value Date   WBC 6.7 02/03/2014   HGB 16.2 02/03/2014   HCT 46.1 02/03/2014   MCV 96.4 02/03/2014   PLT 291 02/03/2014   PROTIME: No results for input(s): LABPROT, INR in the last 72 hours. Chemistry No results for input(s): NA, K, CL, CO2, BUN, CREATININE, CALCIUM, PROT, BILITOT, ALKPHOS, ALT, AST, GLUCOSE in the last 168 hours.  Invalid input(s): LABALBU Lipids No results found for: CHOL, HDL, LDLCALC, TRIG BNP No results found for: PROBNP Thyroid Function Tests: No results for input(s): TSH, T4TOTAL, T3FREE, THYROIDAB in the last 72 hours.  Invalid input(s): FREET3 Miscellaneous No results found for: DDIMER  Radiology/Studies:  No results found.  EKG: Atypical flutter with negative flutter waves inferiorly and negative  flutter waves in lead V1   Assessment and Plan:  Atypical flutter question reverse typical  Sleep disordered breathing   The patient has persistent symptoms of exercise intolerance and atypical atrial flutter. Without a prior history of instrumentation it is possible that this represents a reverse typical flutter. We have reviewed this possibility and have chosen to proceed with TEE guided EP testing and cardioversion and/or flutter ablation depending on the findings at EP testing. We reviewed the potential benefits and risks including but not limited to bleeding perforation and heart block requiring pacing  He will continue his apixoban. We'll discontinue his flecainide but maintaining his metoprolol for right now.  We will need an outpatient sleep study     Virl Axe  Addendum  Echo describes RA and RV enlargement- albeit mild.  This raises issue of L>>R shunting and will do MRI-- this might explain the source of the atypical flutter

## 2016-04-07 NOTE — CV Procedure (Signed)
    TRANSESOPHAGEAL ECHOCARDIOGRAM (TEE) NOTE  INDICATIONS: atrial flutter, right heart enlargement  PROCEDURE:   Informed consent was obtained prior to the procedure. The risks, benefits and alternatives for the procedure were discussed and the patient comprehended these risks.  Risks include, but are not limited to, cough, sore throat, vomiting, nausea, somnolence, esophageal and stomach trauma or perforation, bleeding, low blood pressure, aspiration, pneumonia, infection, trauma to the teeth and death.    After a procedural time-out, the patient was sedated in the cath lab for atrial flutter ablation by general anesthesia.  [name] heart rate, blood pressure, and oxygen saturation are monitored continuously during the procedure. The transesophageal probe was inserted in the esophagus and stomach without difficulty and multiple views were obtained.  The patient was kept under observation until the patient left the procedure room.    Agitated microbubble saline contrast was administered.  COMPLICATIONS:    There were no immediate complications.  Findings:  1. LEFT VENTRICLE: The left ventricular wall thickness is normal.  The left ventricular cavity is normal in size. Wall motion is normal.  LVEF is 55-60%.  2. RIGHT VENTRICLE:  The right ventricle is mildly dilated with normal function without any thrombus or masses.    3. LEFT ATRIUM:  The left atrium is mildly dilated in size without any thrombus or masses.  There is not spontaneous echo contrast ("smoke") in the left atrium consistent with a low flow state.  4. LEFT ATRIAL APPENDAGE:  The left atrial appendage is free of any thrombus or masses. The appendage has single lobes. Pulse doppler indicates moderate flow in the appendage in a pattern of atrial flutter.  5. ATRIAL SEPTUM:  The atrial septum appears intact and is free of thrombus and/or masses.  There is no evidence for interatrial shunting by color doppler and saline  microbubble.  6. RIGHT ATRIUM:  The right atrium is mildly dilated without any thrombus or masses.  7. MITRAL VALVE:  The mitral valve is normal in structure and function with Mild regurgitation.  There were no vegetations or stenosis.  8. AORTIC VALVE:  The aortic valve is trileaflet, normal in structure and function with no regurgitation.  There were no vegetations or stenosis  9. TRICUSPID VALVE:  The tricuspid valve is normal in structure and function with trivial regurgitation.  There were no vegetations or stenosis  10.  PULMONIC VALVE:  The pulmonic valve is normal in structure and function with trivial regurgitation.  There were no vegetations or stenosis.   11. AORTIC ARCH, ASCENDING AND DESCENDING AORTA:  There was no Ron Parker et. Al, 1992) atherosclerosis of the ascending aorta, aortic arch, or proximal descending aorta.  12. PULMONARY VEINS: Anomalous pulmonary venous return was not noted.  13. PERICARDIUM: The pericardium appeared normal and non-thickened.  There is no pericardial effusion.  IMPRESSION:   1. No LAA thrombus 2. Negative for PFO or ASD 3. Mild right heart enlargement 4. LVEF 55-60%, normal wall motion  RECOMMENDATIONS:    1.  Ok to proceed with atrial flutter ablation. No evidence for intracardiac shunting or anomalous venous return to explain right heart enlargement.   Time Spent Directly with the Patient:  45 minutes   Pixie Casino, MD, [facility] Attending Cardiologist [facility]  04/07/2016, 8:20 AM

## 2016-04-07 NOTE — Anesthesia Procedure Notes (Signed)
Procedure Name: Intubation Date/Time: 04/07/2016 7:58 AM Performed by: [place] Pink Pre-anesthesia Checklist: Patient identified, Emergency Drugs available, Suction available, Patient being monitored and Timeout performed Patient Re-evaluated:Patient Re-evaluated prior to inductionOxygen Delivery Method: Circle system utilized Preoxygenation: Pre-oxygenation with 100% oxygen Intubation Type: IV induction Ventilation: Two handed mask ventilation required Laryngoscope Size: Mac and 4 Grade View: Grade II Tube type: Oral Tube size: 7.5 mm Number of attempts: 1 Airway Equipment and Method: Stylet Placement Confirmation: positive ETCO2,  ETT inserted through vocal cords under direct vision and breath sounds checked- equal and bilateral Secured at: 23 cm Tube secured with: Tape Dental Injury: Teeth and Oropharynx as per pre-operative assessment

## 2016-04-08 DIAGNOSIS — I483 Typical atrial flutter: Secondary | ICD-10-CM | POA: Diagnosis not present

## 2016-04-08 DIAGNOSIS — I484 Atypical atrial flutter: Secondary | ICD-10-CM | POA: Diagnosis not present

## 2016-04-08 DIAGNOSIS — I4891 Unspecified atrial fibrillation: Secondary | ICD-10-CM | POA: Diagnosis not present

## 2016-04-08 DIAGNOSIS — Z7901 Long term (current) use of anticoagulants: Secondary | ICD-10-CM | POA: Diagnosis not present

## 2016-04-08 DIAGNOSIS — G4730 Sleep apnea, unspecified: Secondary | ICD-10-CM | POA: Diagnosis not present

## 2016-04-08 DIAGNOSIS — Z79899 Other long term (current) drug therapy: Secondary | ICD-10-CM | POA: Diagnosis not present

## 2016-04-08 DIAGNOSIS — F17290 Nicotine dependence, other tobacco product, uncomplicated: Secondary | ICD-10-CM | POA: Diagnosis not present

## 2016-04-08 NOTE — Discharge Instructions (Signed)
No driving for 3 days week. No lifting over 5 lbs for 1 week. No vigorous or sexual activity for 1 week. You may return to work on 04/10/16. Keep procedure site clean & dry. If you notice increased pain, swelling, bleeding or pus, call/return!  You may shower, but no soaking baths/hot tubs/pools for 1 week.

## 2016-04-17 ENCOUNTER — Encounter: Payer: Self-pay | Admitting: Cardiovascular Disease

## 2016-05-25 ENCOUNTER — Encounter: Payer: Self-pay | Admitting: Internal Medicine

## 2016-05-25 ENCOUNTER — Ambulatory Visit (INDEPENDENT_AMBULATORY_CARE_PROVIDER_SITE_OTHER): Payer: 59 | Admitting: Internal Medicine

## 2016-05-25 VITALS — BP 130/78 | HR 73 | Ht 76.0 in | Wt 229.2 lb

## 2016-05-25 DIAGNOSIS — I4892 Unspecified atrial flutter: Secondary | ICD-10-CM | POA: Diagnosis not present

## 2016-05-25 DIAGNOSIS — I484 Atypical atrial flutter: Secondary | ICD-10-CM | POA: Diagnosis not present

## 2016-05-26 NOTE — Progress Notes (Signed)
      Patient Care Team: Idelle Crouch, MD as PCP - General (Internal Medicine)   HPI  Randall Shannon is a 60 y.o. male Seen following RFCA for Aflutter-reverse typical NO further palpitations No symptoms of chest pain exercise intolerance No longer on apixoban   Records and Results Reviewed   Past Medical History:  Diagnosis Date  . Atypical atrial flutter ([place])   . Sleep-disordered breathing     Past Surgical History:  Procedure Laterality Date  . APPENDECTOMY    . BACK SURGERY  2009  . ELECTROPHYSIOLOGIC STUDY N/A 04/07/2016   Procedure: A-Flutter Ablation;  Surgeon: Deboraha Sprang, MD;  Location: [facility];  Service: Cardiovascular;  Laterality: N/A;  . HERNIA REPAIR Bilateral    2 left side, 1 on right inguinal  . LUMBAR LAMINECTOMY/DECOMPRESSION MICRODISCECTOMY N/A 02/04/2014   Procedure: Lumbar Three-Four Laminectomy, Lumbar Two Laminotomy;  Surgeon: Ophelia Charter, MD;  Location: [place];  Service: Neurosurgery;  Laterality: N/A;  Lumbar Three-Four Laminectomy, Lumbar Two Laminotomy  . TONSILLECTOMY      Current Outpatient Prescriptions  Medication Sig Dispense Refill  . clonazePAM (KLONOPIN) 1 MG tablet Take 1 mg by mouth at bedtime as needed for anxiety.   1  . pramipexole (MIRAPEX) 1 MG tablet Take 1 mg by mouth 2 (two) times daily with a meal.     . RAPAFLO 8 MG CAPS capsule Take 8 mg by mouth daily with breakfast.     . tadalafil (CIALIS) 5 MG tablet Take 5 mg by mouth daily.      No current facility-administered medications for this visit.     Allergies  Allergen Reactions  . Oysters [Shellfish Allergy] Other (See Comments)    GI upset      Review of Systems negative except from HPI and PMH  Physical Exam BP 130/78 (BP Location: Left Arm, Patient Position: Sitting, Cuff Size: Normal)   Pulse 73   Ht 6\' 4"  (1.93 m)   Wt 229 lb 4 oz (104 kg)   BMI 27.91 kg/m  Well developed and well nourished in no acute distress HENT  normal E scleral and icterus clear Neck Supple  Regular rate   No clubbing cyanosi sAlert and oriented, grossly normal motor and  sensory function Skin Warm and Dry  ECG  Sinus rhythm  Assessment and  Plan Atrial flutter s/p ablation

## 2016-06-09 DIAGNOSIS — D485 Neoplasm of uncertain behavior of skin: Secondary | ICD-10-CM | POA: Diagnosis not present

## 2016-06-09 DIAGNOSIS — X32XXXA Exposure to sunlight, initial encounter: Secondary | ICD-10-CM | POA: Diagnosis not present

## 2016-06-09 DIAGNOSIS — L570 Actinic keratosis: Secondary | ICD-10-CM | POA: Diagnosis not present

## 2016-06-09 DIAGNOSIS — L821 Other seborrheic keratosis: Secondary | ICD-10-CM | POA: Diagnosis not present

## 2016-06-09 DIAGNOSIS — L814 Other melanin hyperpigmentation: Secondary | ICD-10-CM | POA: Diagnosis not present

## 2016-06-20 DIAGNOSIS — H524 Presbyopia: Secondary | ICD-10-CM | POA: Diagnosis not present

## 2016-06-20 DIAGNOSIS — H5213 Myopia, bilateral: Secondary | ICD-10-CM | POA: Diagnosis not present

## 2016-06-20 DIAGNOSIS — H52223 Regular astigmatism, bilateral: Secondary | ICD-10-CM | POA: Diagnosis not present

## 2016-09-20 DIAGNOSIS — E538 Deficiency of other specified B group vitamins: Secondary | ICD-10-CM | POA: Diagnosis not present

## 2016-09-20 DIAGNOSIS — Z79899 Other long term (current) drug therapy: Secondary | ICD-10-CM | POA: Diagnosis not present

## 2016-09-20 DIAGNOSIS — E559 Vitamin D deficiency, unspecified: Secondary | ICD-10-CM | POA: Diagnosis not present

## 2016-09-20 DIAGNOSIS — Z13220 Encounter for screening for lipoid disorders: Secondary | ICD-10-CM | POA: Diagnosis not present

## 2016-09-20 DIAGNOSIS — Z125 Encounter for screening for malignant neoplasm of prostate: Secondary | ICD-10-CM | POA: Diagnosis not present

## 2016-09-26 DIAGNOSIS — Z0000 Encounter for general adult medical examination without abnormal findings: Secondary | ICD-10-CM | POA: Diagnosis not present

## 2016-09-26 DIAGNOSIS — E538 Deficiency of other specified B group vitamins: Secondary | ICD-10-CM | POA: Diagnosis not present

## 2016-09-26 DIAGNOSIS — M48061 Spinal stenosis, lumbar region without neurogenic claudication: Secondary | ICD-10-CM | POA: Diagnosis not present

## 2016-09-26 DIAGNOSIS — Z79899 Other long term (current) drug therapy: Secondary | ICD-10-CM | POA: Diagnosis not present

## 2016-09-26 DIAGNOSIS — Z125 Encounter for screening for malignant neoplasm of prostate: Secondary | ICD-10-CM | POA: Diagnosis not present

## 2016-09-26 DIAGNOSIS — N401 Enlarged prostate with lower urinary tract symptoms: Secondary | ICD-10-CM | POA: Diagnosis not present

## 2016-09-26 DIAGNOSIS — G2581 Restless legs syndrome: Secondary | ICD-10-CM | POA: Diagnosis not present

## 2016-09-26 DIAGNOSIS — Z13220 Encounter for screening for lipoid disorders: Secondary | ICD-10-CM | POA: Diagnosis not present

## 2017-05-31 DIAGNOSIS — H16002 Unspecified corneal ulcer, left eye: Secondary | ICD-10-CM | POA: Diagnosis not present

## 2017-06-01 DIAGNOSIS — H16002 Unspecified corneal ulcer, left eye: Secondary | ICD-10-CM | POA: Diagnosis not present

## 2017-06-13 DIAGNOSIS — Z85828 Personal history of other malignant neoplasm of skin: Secondary | ICD-10-CM | POA: Diagnosis not present

## 2017-06-13 DIAGNOSIS — D2272 Melanocytic nevi of left lower limb, including hip: Secondary | ICD-10-CM | POA: Diagnosis not present

## 2017-06-13 DIAGNOSIS — D225 Melanocytic nevi of trunk: Secondary | ICD-10-CM | POA: Diagnosis not present

## 2017-06-13 DIAGNOSIS — D2261 Melanocytic nevi of right upper limb, including shoulder: Secondary | ICD-10-CM | POA: Diagnosis not present

## 2017-06-27 DIAGNOSIS — H524 Presbyopia: Secondary | ICD-10-CM | POA: Diagnosis not present

## 2017-06-27 DIAGNOSIS — H52223 Regular astigmatism, bilateral: Secondary | ICD-10-CM | POA: Diagnosis not present

## 2017-06-27 DIAGNOSIS — H5213 Myopia, bilateral: Secondary | ICD-10-CM | POA: Diagnosis not present

## 2017-11-16 DIAGNOSIS — R509 Fever, unspecified: Secondary | ICD-10-CM | POA: Diagnosis not present

## 2018-02-26 DIAGNOSIS — D485 Neoplasm of uncertain behavior of skin: Secondary | ICD-10-CM | POA: Diagnosis not present

## 2018-02-26 DIAGNOSIS — L821 Other seborrheic keratosis: Secondary | ICD-10-CM | POA: Diagnosis not present

## 2018-04-01 DIAGNOSIS — E559 Vitamin D deficiency, unspecified: Secondary | ICD-10-CM | POA: Diagnosis not present

## 2018-04-01 DIAGNOSIS — Z125 Encounter for screening for malignant neoplasm of prostate: Secondary | ICD-10-CM | POA: Diagnosis not present

## 2018-04-01 DIAGNOSIS — E7800 Pure hypercholesterolemia, unspecified: Secondary | ICD-10-CM | POA: Diagnosis not present

## 2018-04-01 DIAGNOSIS — Z79899 Other long term (current) drug therapy: Secondary | ICD-10-CM | POA: Diagnosis not present

## 2018-04-01 DIAGNOSIS — E538 Deficiency of other specified B group vitamins: Secondary | ICD-10-CM | POA: Diagnosis not present

## 2018-04-08 ENCOUNTER — Other Ambulatory Visit: Payer: Self-pay | Admitting: Neurology

## 2018-04-08 ENCOUNTER — Ambulatory Visit
Admission: RE | Admit: 2018-04-08 | Discharge: 2018-04-08 | Disposition: A | Discharge status: Home / Self Care | Payer: 59 | Source: Ambulatory Visit | Attending: Neurology | Admitting: Neurology

## 2018-04-08 DIAGNOSIS — M47812 Spondylosis without myelopathy or radiculopathy, cervical region: Secondary | ICD-10-CM | POA: Diagnosis not present

## 2018-04-08 DIAGNOSIS — M6258 Muscle wasting and atrophy, not elsewhere classified, other site: Secondary | ICD-10-CM | POA: Insufficient documentation

## 2018-04-08 DIAGNOSIS — M4802 Spinal stenosis, cervical region: Secondary | ICD-10-CM | POA: Insufficient documentation

## 2018-04-09 DIAGNOSIS — M5137 Other intervertebral disc degeneration, lumbosacral region: Secondary | ICD-10-CM | POA: Diagnosis not present

## 2018-04-09 DIAGNOSIS — N401 Enlarged prostate with lower urinary tract symptoms: Secondary | ICD-10-CM | POA: Diagnosis not present

## 2018-04-09 DIAGNOSIS — M4712 Other spondylosis with myelopathy, cervical region: Secondary | ICD-10-CM | POA: Diagnosis not present

## 2018-04-09 DIAGNOSIS — Z0000 Encounter for general adult medical examination without abnormal findings: Secondary | ICD-10-CM | POA: Diagnosis not present

## 2018-04-09 DIAGNOSIS — G2581 Restless legs syndrome: Secondary | ICD-10-CM | POA: Diagnosis not present

## 2018-04-09 DIAGNOSIS — R030 Elevated blood-pressure reading, without diagnosis of hypertension: Secondary | ICD-10-CM | POA: Diagnosis not present

## 2018-04-09 DIAGNOSIS — Z6827 Body mass index (BMI) 27.0-27.9, adult: Secondary | ICD-10-CM | POA: Diagnosis not present

## 2018-04-10 ENCOUNTER — Other Ambulatory Visit: Payer: Self-pay | Admitting: Neurosurgery

## 2018-05-03 NOTE — Pre-Procedure Instructions (Signed)
Rocco Serene, MD  05/03/2018      [place], [place] [address] Phone: [phone] Fax: [phone]    Your procedure is scheduled on July 22  Report to [facility] [place].M.  Call this number if you have problems the morning of surgery:  [phone]   Remember:  Do not eat or drink after midnight.    Take these medicines the morning of surgery with A SIP OF WATER  tamsulosin (FLOMAX)  7 days prior to surgery STOP taking any Aspirin(unless otherwise instructed by your surgeon), Aleve, Naproxen, Ibuprofen, Motrin, Advil, Goody's, BC's, all herbal medications, fish oil, and all vitamins    Do not wear jewelry  Do not wear lotions, powders, or cologne, or deodorant.   Men may shave face and neck.  Do not bring valuables to the hospital.  [facility] is not responsible for any belongings or valuables.  Contacts, dentures or bridgework may not be worn into surgery.  Leave your suitcase in the car.  After surgery it may be brought to your room.  For patients admitted to the hospital, discharge time will be determined by your treatment team.  Patients discharged the day of surgery will not be allowed to drive home.    Special instructions:   [place]- Preparing For Surgery  Before surgery, you can play an important role. Because skin is not sterile, your skin needs to be as free of germs as possible. You can reduce the number of germs on your skin by washing with CHG (chlorahexidine gluconate) Soap before surgery.  CHG is an antiseptic cleaner which kills germs and bonds with the skin to continue killing germs even after washing.    Oral Hygiene is also important to reduce your risk of infection.  Remember - BRUSH YOUR TEETH THE MORNING OF SURGERY WITH YOUR REGULAR TOOTHPASTE  Please do not use if you have an allergy to CHG or antibacterial soaps. If your skin  becomes reddened/irritated stop using the CHG.  Do not shave (including legs and underarms) for at least 48 hours prior to first CHG shower. It is OK to shave your face.  Please follow these instructions carefully.   1. Shower the NIGHT BEFORE SURGERY and the MORNING OF SURGERY with CHG.   2. If you chose to wash your hair, wash your hair first as usual with your normal shampoo.  3. After you shampoo, rinse your hair and body thoroughly to remove the shampoo.  4. Use CHG as you would any other liquid soap. You can apply CHG directly to the skin and wash gently with a scrungie or a clean washcloth.   5. Apply the CHG Soap to your body ONLY FROM THE NECK DOWN.  Do not use on open wounds or open sores. Avoid contact with your eyes, ears, mouth and genitals (private parts). Wash Face and genitals (private parts)  with your normal soap.  6. Wash thoroughly, paying special attention to the area where your surgery will be performed.  7. Thoroughly rinse your body with warm water from the neck down.  8. DO NOT shower/wash with your normal soap after using and rinsing off the CHG Soap.  9. Pat yourself dry with a CLEAN TOWEL.  10. Wear CLEAN PAJAMAS to bed the night before surgery, wear comfortable clothes the morning of surgery  11. Place CLEAN SHEETS on your  bed the night of your first shower and DO NOT SLEEP WITH PETS.    Day of Surgery:  Do not apply any deodorants/lotions.  Please wear clean clothes to the hospital/surgery center.   Remember to brush your teeth WITH YOUR REGULAR TOOTHPASTE.    Please read over the following fact sheets that you were given.

## 2018-05-07 ENCOUNTER — Encounter (HOSPITAL_COMMUNITY): Payer: Self-pay

## 2018-05-07 ENCOUNTER — Other Ambulatory Visit: Payer: Self-pay

## 2018-05-07 ENCOUNTER — Encounter (HOSPITAL_COMMUNITY)
Admission: RE | Admit: 2018-05-07 | Discharge: 2018-05-07 | Disposition: A | Discharge status: Home / Self Care | Payer: 59 | Source: Ambulatory Visit | Attending: Neurosurgery | Admitting: Neurosurgery

## 2018-05-07 DIAGNOSIS — Z01810 Encounter for preprocedural cardiovascular examination: Secondary | ICD-10-CM | POA: Diagnosis not present

## 2018-05-07 DIAGNOSIS — Z01812 Encounter for preprocedural laboratory examination: Secondary | ICD-10-CM | POA: Diagnosis not present

## 2018-05-07 HISTORY — DX: Other spondylosis with myelopathy, cervical region: M47.12

## 2018-05-07 HISTORY — DX: Restless legs syndrome: G25.81

## 2018-05-07 HISTORY — DX: Other spondylosis with radiculopathy, cervical region: M47.22

## 2018-05-07 HISTORY — DX: Benign prostatic hyperplasia without lower urinary tract symptoms: N40.0

## 2018-05-07 LAB — BASIC METABOLIC PANEL WITH GFR
Anion gap: 8 (ref 5–15)
BUN: 13 mg/dL (ref 8–23)
CO2: 29 mmol/L (ref 22–32)
Calcium: 9.2 mg/dL (ref 8.9–10.3)
Chloride: 105 mmol/L (ref 98–111)
Creatinine, Ser: 1.04 mg/dL (ref 0.61–1.24)
GFR calc Af Amer: >60 mL/min
GFR calc non Af Amer: >60 mL/min
Glucose, Bld: 102 mg/dL — ABNORMAL HIGH (ref 70–99)
Potassium: 4.4 mmol/L (ref 3.5–5.1)
Sodium: 142 mmol/L (ref 135–145)

## 2018-05-07 LAB — ABO/RH: ABO/RH(D): O POS

## 2018-05-07 LAB — SURGICAL PCR SCREEN
MRSA, PCR: NEGATIVE
STAPHYLOCOCCUS AUREUS: NEGATIVE

## 2018-05-07 LAB — TYPE AND SCREEN
ABO/RH(D): O POS
Antibody Screen: NEGATIVE

## 2018-05-07 LAB — CBC
HCT: 46.1 % (ref 39.0–52.0)
Hemoglobin: 14.6 g/dL (ref 13.0–17.0)
MCH: 33.2 pg (ref 26.0–34.0)
MCHC: 31.7 g/dL (ref 30.0–36.0)
MCV: 104.8 fL — ABNORMAL HIGH (ref 78.0–100.0)
Platelets: 239 K/uL (ref 150–400)
RBC: 4.4 MIL/uL (ref 4.22–5.81)
RDW: 12.3 % (ref 11.5–15.5)
WBC: 4.7 K/uL (ref 4.0–10.5)

## 2018-05-07 NOTE — Progress Notes (Signed)
PCP - Dr. Georgie Chard  Cardiologist - Dr. Virl Axe  Chest x-ray - Denies  EKG - 05/07/18   Stress Test - Denies  ECHO - 03/2016 (E)  Cardiac Cath - Denies  Sleep Study - Yes- Negative CPAP - None  LABS- 05/07/18:  CBC, BMP, T/S  ASA- Denies   Anesthesia- Yes- EKG  Pt denies having chest pain, sob, or fever at this time. All instructions explained to the pt, with a verbal understanding of the material. Pt agrees to go over the instructions while at home for a better understanding. The opportunity to ask questions was provided.

## 2018-05-13 ENCOUNTER — Inpatient Hospital Stay (HOSPITAL_COMMUNITY): Payer: 59 | Admitting: Anesthesiology

## 2018-05-13 ENCOUNTER — Encounter (HOSPITAL_COMMUNITY)
Admission: RE | Disposition: A | Discharge status: Home / Self Care | Payer: Self-pay | Source: Ambulatory Visit | Attending: Neurosurgery

## 2018-05-13 ENCOUNTER — Other Ambulatory Visit: Payer: Self-pay

## 2018-05-13 ENCOUNTER — Inpatient Hospital Stay (HOSPITAL_COMMUNITY)
Admission: RE | Admit: 2018-05-13 | Discharge: 2018-05-14 | DRG: 472 | Disposition: A | Discharge status: Home / Self Care | Payer: 59 | Source: Ambulatory Visit | Attending: Neurosurgery | Admitting: Neurosurgery

## 2018-05-13 ENCOUNTER — Encounter (HOSPITAL_COMMUNITY): Payer: Self-pay | Admitting: Surgery

## 2018-05-13 ENCOUNTER — Inpatient Hospital Stay (HOSPITAL_COMMUNITY): Payer: 59

## 2018-05-13 DIAGNOSIS — N400 Enlarged prostate without lower urinary tract symptoms: Secondary | ICD-10-CM | POA: Diagnosis present

## 2018-05-13 DIAGNOSIS — Z91013 Allergy to seafood: Secondary | ICD-10-CM | POA: Diagnosis not present

## 2018-05-13 DIAGNOSIS — Z419 Encounter for procedure for purposes other than remedying health state, unspecified: Secondary | ICD-10-CM

## 2018-05-13 DIAGNOSIS — I484 Atypical atrial flutter: Secondary | ICD-10-CM | POA: Diagnosis present

## 2018-05-13 DIAGNOSIS — M50121 Cervical disc disorder at C4-C5 level with radiculopathy: Secondary | ICD-10-CM | POA: Diagnosis not present

## 2018-05-13 DIAGNOSIS — Z981 Arthrodesis status: Secondary | ICD-10-CM | POA: Diagnosis not present

## 2018-05-13 DIAGNOSIS — M4712 Other spondylosis with myelopathy, cervical region: Principal | ICD-10-CM | POA: Diagnosis present

## 2018-05-13 DIAGNOSIS — Z8249 Family history of ischemic heart disease and other diseases of the circulatory system: Secondary | ICD-10-CM | POA: Diagnosis not present

## 2018-05-13 DIAGNOSIS — M50122 Cervical disc disorder at C5-C6 level with radiculopathy: Secondary | ICD-10-CM | POA: Diagnosis not present

## 2018-05-13 DIAGNOSIS — Z8679 Personal history of other diseases of the circulatory system: Secondary | ICD-10-CM | POA: Diagnosis not present

## 2018-05-13 DIAGNOSIS — M4722 Other spondylosis with radiculopathy, cervical region: Secondary | ICD-10-CM

## 2018-05-13 DIAGNOSIS — Z9089 Acquired absence of other organs: Secondary | ICD-10-CM | POA: Diagnosis not present

## 2018-05-13 DIAGNOSIS — Z9049 Acquired absence of other specified parts of digestive tract: Secondary | ICD-10-CM | POA: Diagnosis not present

## 2018-05-13 DIAGNOSIS — M50123 Cervical disc disorder at C6-C7 level with radiculopathy: Secondary | ICD-10-CM | POA: Diagnosis not present

## 2018-05-13 DIAGNOSIS — M5010 Cervical disc disorder with radiculopathy, unspecified cervical region: Secondary | ICD-10-CM | POA: Diagnosis present

## 2018-05-13 DIAGNOSIS — G2581 Restless legs syndrome: Secondary | ICD-10-CM | POA: Diagnosis present

## 2018-05-13 DIAGNOSIS — M4802 Spinal stenosis, cervical region: Secondary | ICD-10-CM | POA: Diagnosis not present

## 2018-05-13 DIAGNOSIS — F17200 Nicotine dependence, unspecified, uncomplicated: Secondary | ICD-10-CM | POA: Diagnosis present

## 2018-05-13 DIAGNOSIS — M50321 Other cervical disc degeneration at C4-C5 level: Secondary | ICD-10-CM | POA: Diagnosis not present

## 2018-05-13 DIAGNOSIS — G4730 Sleep apnea, unspecified: Secondary | ICD-10-CM | POA: Diagnosis present

## 2018-05-13 DIAGNOSIS — M4322 Fusion of spine, cervical region: Secondary | ICD-10-CM | POA: Diagnosis not present

## 2018-05-13 DIAGNOSIS — Z823 Family history of stroke: Secondary | ICD-10-CM

## 2018-05-13 HISTORY — PX: ANTERIOR CERVICAL DECOMP/DISCECTOMY FUSION: SHX1161

## 2018-05-13 SURGERY — ANTERIOR CERVICAL DECOMPRESSION/DISCECTOMY FUSION 3 LEVELS
Anesthesia: General | Site: Spine Cervical

## 2018-05-13 MED ORDER — ONDANSETRON HCL 4 MG/2ML IJ SOLN
INTRAMUSCULAR | Status: AC
  Filled 2018-05-13: qty 2

## 2018-05-13 MED ORDER — THROMBIN (RECOMBINANT) 20000 UNITS EX SOLR
CUTANEOUS | Status: AC
  Filled 2018-05-13: qty 20000

## 2018-05-13 MED ORDER — CEFAZOLIN SODIUM-DEXTROSE 2-4 GM/100ML-% IV SOLN
2.0000 g | INTRAVENOUS | Status: AC
  Administered 2018-05-13: 2 g via INTRAVENOUS

## 2018-05-13 MED ORDER — MORPHINE SULFATE (PF) 4 MG/ML IV SOLN
4.0000 mg | INTRAVENOUS | Status: DC | PRN
  Administered 2018-05-13: 4 mg via INTRAVENOUS
  Filled 2018-05-13: qty 1

## 2018-05-13 MED ORDER — CEFAZOLIN SODIUM-DEXTROSE 2-4 GM/100ML-% IV SOLN
2.0000 g | Freq: Three times a day (TID) | INTRAVENOUS | Status: AC
  Administered 2018-05-13 (×2): 2 g via INTRAVENOUS
  Filled 2018-05-13 (×2): qty 100

## 2018-05-13 MED ORDER — PANTOPRAZOLE SODIUM 20 MG PO TBEC
20.0000 mg | DELAYED_RELEASE_TABLET | Freq: Two times a day (BID) | ORAL | Status: DC
  Administered 2018-05-13 – 2018-05-14 (×2): 20 mg via ORAL
  Filled 2018-05-13 (×2): qty 1

## 2018-05-13 MED ORDER — ROCURONIUM BROMIDE 10 MG/ML (PF) SYRINGE
PREFILLED_SYRINGE | INTRAVENOUS | Status: AC
  Filled 2018-05-13: qty 10

## 2018-05-13 MED ORDER — LIDOCAINE 2% (20 MG/ML) 5 ML SYRINGE
INTRAMUSCULAR | Status: AC
  Filled 2018-05-13: qty 5

## 2018-05-13 MED ORDER — MIDAZOLAM HCL 2 MG/2ML IJ SOLN
INTRAMUSCULAR | Status: AC
  Filled 2018-05-13: qty 2

## 2018-05-13 MED ORDER — ONDANSETRON HCL 4 MG PO TABS: 4.0000 mg | ORAL_TABLET | Freq: Four times a day (QID) | ORAL | Status: DC | PRN

## 2018-05-13 MED ORDER — MIDAZOLAM HCL 5 MG/5ML IJ SOLN
INTRAMUSCULAR | Status: DC | PRN
  Administered 2018-05-13: 2 mg via INTRAVENOUS

## 2018-05-13 MED ORDER — DOCUSATE SODIUM 100 MG PO CAPS
100.0000 mg | ORAL_CAPSULE | Freq: Two times a day (BID) | ORAL | Status: DC
  Administered 2018-05-13 – 2018-05-14 (×2): 100 mg via ORAL
  Filled 2018-05-13 (×2): qty 1

## 2018-05-13 MED ORDER — 0.9 % SODIUM CHLORIDE (POUR BTL) OPTIME
TOPICAL | Status: DC | PRN
  Administered 2018-05-13: 1000 mL

## 2018-05-13 MED ORDER — SODIUM CHLORIDE 0.9 % IV SOLN
INTRAVENOUS | Status: DC | PRN
  Administered 2018-05-13: 500 mL

## 2018-05-13 MED ORDER — PHENOL 1.4 % MT LIQD: 1.0000 | OROMUCOSAL | Status: DC | PRN

## 2018-05-13 MED ORDER — FENTANYL CITRATE (PF) 100 MCG/2ML IJ SOLN
INTRAMUSCULAR | Status: DC | PRN
  Administered 2018-05-13: 50 ug via INTRAVENOUS
  Administered 2018-05-13: 25 ug via INTRAVENOUS
  Administered 2018-05-13 (×2): 50 ug via INTRAVENOUS
  Administered 2018-05-13: 25 ug via INTRAVENOUS
  Administered 2018-05-13: 50 ug via INTRAVENOUS

## 2018-05-13 MED ORDER — CHLORHEXIDINE GLUCONATE CLOTH 2 % EX PADS: 6.0000 | MEDICATED_PAD | Freq: Once | CUTANEOUS | Status: DC

## 2018-05-13 MED ORDER — PRAMIPEXOLE DIHYDROCHLORIDE 1 MG PO TABS
1.0000 mg | ORAL_TABLET | Freq: Two times a day (BID) | ORAL | Status: DC
  Administered 2018-05-13 – 2018-05-14 (×2): 1 mg via ORAL
  Filled 2018-05-13 (×2): qty 1

## 2018-05-13 MED ORDER — TAMSULOSIN HCL 0.4 MG PO CAPS
0.8000 mg | ORAL_CAPSULE | ORAL | Status: DC
  Administered 2018-05-14: 0.8 mg via ORAL
  Filled 2018-05-13: qty 2

## 2018-05-13 MED ORDER — PHENYLEPHRINE 40 MCG/ML (10ML) SYRINGE FOR IV PUSH (FOR BLOOD PRESSURE SUPPORT)
PREFILLED_SYRINGE | INTRAVENOUS | Status: AC
  Filled 2018-05-13: qty 10

## 2018-05-13 MED ORDER — CEFAZOLIN SODIUM-DEXTROSE 2-4 GM/100ML-% IV SOLN
INTRAVENOUS | Status: AC
  Filled 2018-05-13: qty 100

## 2018-05-13 MED ORDER — ROCURONIUM BROMIDE 100 MG/10ML IV SOLN
INTRAVENOUS | Status: DC | PRN
  Administered 2018-05-13: 10 mg via INTRAVENOUS
  Administered 2018-05-13: 30 mg via INTRAVENOUS
  Administered 2018-05-13 (×2): 20 mg via INTRAVENOUS
  Administered 2018-05-13: 50 mg via INTRAVENOUS
  Administered 2018-05-13: 20 mg via INTRAVENOUS

## 2018-05-13 MED ORDER — PHENYLEPHRINE HCL 10 MG/ML IJ SOLN
INTRAMUSCULAR | Status: DC | PRN
  Administered 2018-05-13: 80 ug via INTRAVENOUS

## 2018-05-13 MED ORDER — DEXAMETHASONE SODIUM PHOSPHATE 10 MG/ML IJ SOLN
INTRAMUSCULAR | Status: DC | PRN
  Administered 2018-05-13: 10 mg via INTRAVENOUS

## 2018-05-13 MED ORDER — SUGAMMADEX SODIUM 200 MG/2ML IV SOLN
INTRAVENOUS | Status: DC | PRN
  Administered 2018-05-13: 193.2 mg via INTRAVENOUS

## 2018-05-13 MED ORDER — PROPOFOL 10 MG/ML IV BOLUS
INTRAVENOUS | Status: AC
  Filled 2018-05-13: qty 40

## 2018-05-13 MED ORDER — BACITRACIN ZINC 500 UNIT/GM EX OINT
TOPICAL_OINTMENT | CUTANEOUS | Status: DC | PRN
  Administered 2018-05-13: 1 via TOPICAL

## 2018-05-13 MED ORDER — ONDANSETRON HCL 4 MG/2ML IJ SOLN
INTRAMUSCULAR | Status: DC | PRN
  Administered 2018-05-13: 4 mg via INTRAVENOUS

## 2018-05-13 MED ORDER — DEXAMETHASONE SODIUM PHOSPHATE 4 MG/ML IJ SOLN: 4.0000 mg | Freq: Four times a day (QID) | INTRAMUSCULAR | Status: AC

## 2018-05-13 MED ORDER — EPHEDRINE SULFATE 50 MG/ML IJ SOLN
INTRAMUSCULAR | Status: DC | PRN
  Administered 2018-05-13: 10 mg via INTRAVENOUS

## 2018-05-13 MED ORDER — PROPOFOL 10 MG/ML IV BOLUS
INTRAVENOUS | Status: DC | PRN
  Administered 2018-05-13: 120 mg via INTRAVENOUS

## 2018-05-13 MED ORDER — MEPERIDINE HCL 50 MG/ML IJ SOLN: 6.2500 mg | INTRAMUSCULAR | Status: DC | PRN

## 2018-05-13 MED ORDER — ACETAMINOPHEN 500 MG PO TABS
1000.0000 mg | ORAL_TABLET | Freq: Four times a day (QID) | ORAL | Status: AC
  Administered 2018-05-13 – 2018-05-14 (×4): 1000 mg via ORAL
  Filled 2018-05-13 (×4): qty 2

## 2018-05-13 MED ORDER — ONDANSETRON HCL 4 MG/2ML IJ SOLN: 4.0000 mg | Freq: Four times a day (QID) | INTRAMUSCULAR | Status: DC | PRN

## 2018-05-13 MED ORDER — ACETAMINOPHEN 325 MG PO TABS: 650.0000 mg | ORAL_TABLET | ORAL | Status: DC | PRN

## 2018-05-13 MED ORDER — ACETAMINOPHEN 10 MG/ML IV SOLN
INTRAVENOUS | Status: AC
  Filled 2018-05-13: qty 100

## 2018-05-13 MED ORDER — DEXAMETHASONE SODIUM PHOSPHATE 10 MG/ML IJ SOLN
INTRAMUSCULAR | Status: AC
  Filled 2018-05-13: qty 1

## 2018-05-13 MED ORDER — OXYCODONE HCL 5 MG PO TABS
10.0000 mg | ORAL_TABLET | ORAL | Status: DC | PRN
  Administered 2018-05-13 – 2018-05-14 (×4): 10 mg via ORAL
  Filled 2018-05-13 (×4): qty 2

## 2018-05-13 MED ORDER — DEXAMETHASONE 4 MG PO TABS
4.0000 mg | ORAL_TABLET | Freq: Four times a day (QID) | ORAL | Status: AC
  Administered 2018-05-13 (×2): 4 mg via ORAL
  Filled 2018-05-13 (×2): qty 1

## 2018-05-13 MED ORDER — CYCLOBENZAPRINE HCL 10 MG PO TABS
10.0000 mg | ORAL_TABLET | Freq: Three times a day (TID) | ORAL | Status: DC | PRN
  Administered 2018-05-13 (×2): 10 mg via ORAL
  Filled 2018-05-13: qty 1

## 2018-05-13 MED ORDER — BUPIVACAINE-EPINEPHRINE (PF) 0.5% -1:200000 IJ SOLN
INTRAMUSCULAR | Status: DC | PRN
  Administered 2018-05-13: 10 mL

## 2018-05-13 MED ORDER — OXYCODONE HCL 5 MG PO TABS
5.0000 mg | ORAL_TABLET | ORAL | Status: DC | PRN
  Administered 2018-05-13: 5 mg via ORAL

## 2018-05-13 MED ORDER — LACTATED RINGERS IV SOLN
INTRAVENOUS | Status: DC
  Administered 2018-05-13: 13:00:00 via INTRAVENOUS

## 2018-05-13 MED ORDER — BACITRACIN ZINC 500 UNIT/GM EX OINT
TOPICAL_OINTMENT | CUTANEOUS | Status: AC
  Filled 2018-05-13: qty 28.35

## 2018-05-13 MED ORDER — SUCCINYLCHOLINE CHLORIDE 200 MG/10ML IV SOSY
PREFILLED_SYRINGE | INTRAVENOUS | Status: AC
  Filled 2018-05-13: qty 10

## 2018-05-13 MED ORDER — CLONAZEPAM 1 MG PO TABS
1.0000 mg | ORAL_TABLET | Freq: Every day | ORAL | Status: DC
  Administered 2018-05-13: 1 mg via ORAL
  Filled 2018-05-13: qty 1

## 2018-05-13 MED ORDER — LIDOCAINE HCL (CARDIAC) PF 100 MG/5ML IV SOSY
PREFILLED_SYRINGE | INTRAVENOUS | Status: DC | PRN
  Administered 2018-05-13: 100 mg via INTRAVENOUS

## 2018-05-13 MED ORDER — OXYCODONE HCL 5 MG PO TABS
ORAL_TABLET | ORAL | Status: AC
  Filled 2018-05-13: qty 1

## 2018-05-13 MED ORDER — HYDROMORPHONE HCL 1 MG/ML IJ SOLN: 0.2500 mg | INTRAMUSCULAR | Status: DC | PRN

## 2018-05-13 MED ORDER — ALUM & MAG HYDROXIDE-SIMETH 200-200-20 MG/5ML PO SUSP: 30.0000 mL | Freq: Four times a day (QID) | ORAL | Status: DC | PRN

## 2018-05-13 MED ORDER — FENTANYL CITRATE (PF) 250 MCG/5ML IJ SOLN
INTRAMUSCULAR | Status: AC
  Filled 2018-05-13: qty 5

## 2018-05-13 MED ORDER — BISACODYL 10 MG RE SUPP
10.0000 mg | Freq: Every day | RECTAL | Status: DC | PRN
Start: 2018-05-13 — End: 2018-05-14

## 2018-05-13 MED ORDER — MENTHOL 3 MG MT LOZG: 1.0000 | LOZENGE | OROMUCOSAL | Status: DC | PRN

## 2018-05-13 MED ORDER — LACTATED RINGERS IV SOLN
INTRAVENOUS | Status: DC | PRN
  Administered 2018-05-13 (×2): via INTRAVENOUS

## 2018-05-13 MED ORDER — ACETAMINOPHEN 650 MG RE SUPP
650.0000 mg | RECTAL | Status: DC | PRN
Start: 2018-05-13 — End: 2018-05-14

## 2018-05-13 MED ORDER — HEMOSTATIC AGENTS (NO CHARGE) OPTIME
TOPICAL | Status: DC | PRN
  Administered 2018-05-13 (×2): 1 via TOPICAL

## 2018-05-13 MED ORDER — CYCLOBENZAPRINE HCL 10 MG PO TABS
ORAL_TABLET | ORAL | Status: AC
  Filled 2018-05-13: qty 1

## 2018-05-13 MED ORDER — ACETAMINOPHEN 10 MG/ML IV SOLN
INTRAVENOUS | Status: DC | PRN
  Administered 2018-05-13: 1000 mg via INTRAVENOUS

## 2018-05-13 MED ORDER — SODIUM CHLORIDE 0.9 % IV SOLN
INTRAVENOUS | Status: DC | PRN
  Administered 2018-05-13: 30 ug/min via INTRAVENOUS

## 2018-05-13 MED ORDER — ONDANSETRON HCL 4 MG/2ML IJ SOLN: 4.0000 mg | Freq: Once | INTRAMUSCULAR | Status: DC | PRN

## 2018-05-13 SURGICAL SUPPLY — 65 items
BAG DECANTER FOR FLEXI CONT (MISCELLANEOUS) ×2 IMPLANT
BASKET BONE COLLECTION (BASKET) ×2 IMPLANT
BENZOIN TINCTURE PRP APPL 2/3 (GAUZE/BANDAGES/DRESSINGS) ×2 IMPLANT
BIT DRILL NEURO 2X3.1 SFT TUCH (MISCELLANEOUS) ×1 IMPLANT
BLADE SURG 15 STRL LF DISP TIS (BLADE) ×1 IMPLANT
BLADE SURG 15 STRL SS (BLADE) ×1
BLADE ULTRA TIP 2M (BLADE) ×2 IMPLANT
BUR BARREL STRAIGHT FLUTE 4.0 (BURR) ×2 IMPLANT
BUR MATCHSTICK NEURO 3.0 LAGG (BURR) ×2 IMPLANT
CANISTER SUCT 3000ML PPV (MISCELLANEOUS) ×2 IMPLANT
CARTRIDGE OIL MAESTRO DRILL (MISCELLANEOUS) ×1 IMPLANT
COVER MAYO STAND STRL (DRAPES) ×2 IMPLANT
DIFFUSER DRILL AIR PNEUMATIC (MISCELLANEOUS) ×2 IMPLANT
DRAPE LAPAROTOMY 100X72 PEDS (DRAPES) ×2 IMPLANT
DRAPE MICROSCOPE LEICA (MISCELLANEOUS) IMPLANT
DRAPE POUCH INSTRU U-SHP 10X18 (DRAPES) ×2 IMPLANT
DRAPE SURG 17X23 STRL (DRAPES) ×4 IMPLANT
DRILL NEURO 2X3.1 SOFT TOUCH (MISCELLANEOUS) ×2
DRSG OPSITE POSTOP 4X6 (GAUZE/BANDAGES/DRESSINGS) ×4 IMPLANT
ELECT REM PT RETURN 9FT ADLT (ELECTROSURGICAL) ×2
ELECTRODE REM PT RTRN 9FT ADLT (ELECTROSURGICAL) ×1 IMPLANT
FLOSEAL 5ML (HEMOSTASIS) ×4 IMPLANT
GAUZE SPONGE 4X4 12PLY STRL (GAUZE/BANDAGES/DRESSINGS) ×2 IMPLANT
GAUZE SPONGE 4X4 12PLY STRL LF (GAUZE/BANDAGES/DRESSINGS) ×2 IMPLANT
GAUZE SPONGE 4X4 16PLY XRAY LF (GAUZE/BANDAGES/DRESSINGS) IMPLANT
GLOVE BIO SURGEON STRL SZ 6.5 (GLOVE) ×2 IMPLANT
GLOVE BIO SURGEON STRL SZ8 (GLOVE) ×2 IMPLANT
GLOVE BIO SURGEON STRL SZ8.5 (GLOVE) ×2 IMPLANT
GLOVE BIOGEL PI IND STRL 6.5 (GLOVE) ×1 IMPLANT
GLOVE BIOGEL PI IND STRL 7.0 (GLOVE) ×4 IMPLANT
GLOVE BIOGEL PI IND STRL 7.5 (GLOVE) ×2 IMPLANT
GLOVE BIOGEL PI INDICATOR 6.5 (GLOVE) ×1
GLOVE BIOGEL PI INDICATOR 7.0 (GLOVE) ×4
GLOVE BIOGEL PI INDICATOR 7.5 (GLOVE) ×2
GLOVE EXAM NITRILE LRG STRL (GLOVE) IMPLANT
GLOVE EXAM NITRILE XL STR (GLOVE) IMPLANT
GLOVE EXAM NITRILE XS STR PU (GLOVE) IMPLANT
GOWN STRL REUS W/ TWL LRG LVL3 (GOWN DISPOSABLE) ×1 IMPLANT
GOWN STRL REUS W/ TWL XL LVL3 (GOWN DISPOSABLE) ×3 IMPLANT
GOWN STRL REUS W/TWL LRG LVL3 (GOWN DISPOSABLE) ×1
GOWN STRL REUS W/TWL XL LVL3 (GOWN DISPOSABLE) ×3
HEMOSTAT POWDER KIT SURGIFOAM (HEMOSTASIS) IMPLANT
KIT BASIN OR (CUSTOM PROCEDURE TRAY) ×2 IMPLANT
KIT TURNOVER KIT B (KITS) ×2 IMPLANT
MARKER SKIN DUAL TIP RULER LAB (MISCELLANEOUS) ×2 IMPLANT
NEEDLE HYPO 22GX1.5 SAFETY (NEEDLE) ×2 IMPLANT
NEEDLE SPNL 18GX3.5 QUINCKE PK (NEEDLE) ×2 IMPLANT
NS IRRIG 1000ML POUR BTL (IV SOLUTION) ×2 IMPLANT
OIL CARTRIDGE MAESTRO DRILL (MISCELLANEOUS) ×2
PACK LAMINECTOMY NEURO (CUSTOM PROCEDURE TRAY) ×2 IMPLANT
PEEK VISTA 14X14X7MM (Peek) ×6 IMPLANT
PIN DISTRACTION 14MM (PIN) ×4 IMPLANT
PLATE ANT CERV XTEND 3 LV 54 (Plate) ×2 IMPLANT
PUTTY KINEX BIOACTIVE 5CC (Bone Implant) ×2 IMPLANT
RUBBERBAND STERILE (MISCELLANEOUS) IMPLANT
SCREW XTD VAR 4.2 SELF TAP (Screw) ×16 IMPLANT
SPONGE INTESTINAL PEANUT (DISPOSABLE) ×4 IMPLANT
SPONGE SURGIFOAM ABS GEL 100 (HEMOSTASIS) IMPLANT
STRIP CLOSURE SKIN 1/2X4 (GAUZE/BANDAGES/DRESSINGS) ×2 IMPLANT
SUT VIC AB 0 CT1 27 (SUTURE) ×1
SUT VIC AB 0 CT1 27XBRD ANTBC (SUTURE) ×1 IMPLANT
SUT VIC AB 3-0 SH 8-18 (SUTURE) ×2 IMPLANT
TOWEL GREEN STERILE (TOWEL DISPOSABLE) ×2 IMPLANT
TOWEL GREEN STERILE FF (TOWEL DISPOSABLE) ×2 IMPLANT
WATER STERILE IRR 1000ML POUR (IV SOLUTION) ×2 IMPLANT

## 2018-05-13 NOTE — Transfer of Care (Signed)
Immediate Anesthesia Transfer of Care Note  Patient: Randall Serene, MD  Procedure(s) Performed: ANTERIOR CERVICAL DECOMPRESSION/DISCECTOMY FUSION , INTERBODY PROSTHESIS, PLATE/SCREWS CERVICAL FOUR- CERVICAL FIVE, CERVICAL FIVE- CERVICAL SIX, CERVICAL SIX- CERVICAL SEVEN (N/A Spine Cervical)  Patient Location: PACU  Anesthesia Type:General  Level of Consciousness: awake, alert  and oriented  Airway & Oxygen Therapy: Patient Spontanous Breathing  Post-op Assessment: Report given to RN and Post -op Vital signs reviewed and stable  Post vital signs: Reviewed and stable  Last Vitals:  Vitals Value Taken Time  BP 107/66 05/13/2018 11:31 AM  Temp    Pulse 90 05/13/2018 11:37 AM  Resp 17 05/13/2018 11:37 AM  SpO2 95 % 05/13/2018 11:37 AM  Vitals shown include unvalidated device data.  Last Pain:  Vitals:   05/13/18 1130  TempSrc:   PainSc: (P) 0-No pain      Patients Stated Pain Goal: 3 (33/38/32 9191)  Complications: No apparent anesthesia complications

## 2018-05-13 NOTE — Progress Notes (Signed)
Subjective: The patient is alert and pleasant.  He looks well.  He has no complaints.  Objective: Vital signs in last 24 hours: Temp:  [97.6 F (36.4 C)-98.6 F (37 C)] 98.6 F (37 C) (07/22 1529) Pulse Rate:  [70-90] 80 (07/22 1529) Resp:  [13-21] 18 (07/22 1529) BP: (107-132)/(47-79) 125/72 (07/22 1529) SpO2:  [94 %-99 %] 98 % (07/22 1529) Weight:  [96.6 kg (213 lb)] 96.6 kg (213 lb) (07/22 0610) Estimated body mass index is 25.93 kg/m as calculated from the following:   Height as of this encounter: 6\' 4"  (1.93 m).   Weight as of this encounter: 96.6 kg (213 lb).   Intake/Output from previous day: No intake/output data recorded. Intake/Output this shift: Total I/O In: 2200 [P.O.:600; I.V.:1600] Out: 700 [Urine:350; Blood:350]  Physical exam the patient is alert and pleasant.  He is moving all 4 extremities well.  There is no weakness.  His dressing has a small bloodstain.  There is no hematoma or shift.  Lab Results: No results for input(s): WBC, HGB, HCT, PLT in the last 72 hours. BMET No results for input(s): NA, K, CL, CO2, GLUCOSE, BUN, CREATININE, CALCIUM in the last 72 hours.  Studies/Results: Dg Cervical Spine 2-3 Views  Result Date: 05/13/2018 CLINICAL DATA:  Cervical decompression EXAM: CERVICAL SPINE - 2-3 VIEW COMPARISON:  04/08/2018 FINDINGS: Two lateral films were obtained intraoperatively during cervical fusion. The initial film demonstrates an instrument in the anterior aspect of the C4-5 disc space. Subsequent film demonstrates evidence of interbody fusion and anterior fixation at C4-5, C5-6 and C6-7. IMPRESSION: Cervical fusion. Electronically Signed   By: Inez Catalina M.D.   On: 05/13/2018 11:31    Assessment/Plan: The patient is doing well.  He will likely go home tomorrow.  LOS: 0 days     Ophelia Charter 05/13/2018, 6:57 PM

## 2018-05-13 NOTE — Op Note (Signed)
Brief history: The patient is a 62 year old white male who has complained of neck right shoulder and arm pain, numbness and weakness.  He has failed medical management.  He was worked up with a cervical MRI which demonstrated degeneration, spondylosis, stenosis at C4-5, C5-6 and C6-7.  I discussed the various treatment options with the patient including surgery.  He has weighed the risks, benefits, and alternatives surgery and decided proceed with a C4-5, C5-6 and C6-7 anterior cervical discectomy, fusion and plating.  Preoperative diagnosis: C4-5, C5-6 and C6-7 disc degeneration, spondylosis, stenosis, cervical radiculopathy, cervical myelopathy, cervicalgia  Postoperative diagnosis: The same  Procedure: C4-5, C5-6 and C6-7 anterior cervical discectomy/decompression; C4-5, C5-6 and C6-7 interbody arthrodesis with local morcellized autograft bone and Kinnex bone graft extender; insertion of interbody prosthesis at C4-5, C5-6 and C6-7 (Zimmer peek interbody prosthesis); anterior cervical plating from C4-C7 with globus titanium plate  Surgeon: Dr. Earle Gell  Asst.: Dr. Ashok Pall  Anesthesia: Gen. endotracheal  Estimated blood loss: 200 cc  Drains: None  Complications: None  Description of procedure: The patient was brought to the operating room by the anesthesia team. General endotracheal anesthesia was induced. A roll was placed under the patient's shoulders to keep the neck in the neutral position. [name] anterior cervical region was then prepared with Betadine scrub and Betadine solution. Sterile drapes were applied.  The area to be incised was then injected with Marcaine with epinephrine solution. I then used a scalpel to make a transverse incision in the patient's left anterior neck. I used the Metzenbaum scissors to divide the platysmal muscle and then to dissect medial to the sternocleidomastoid muscle, jugular vein, and carotid artery. I carefully dissected down towards the  anterior cervical spine identifying the esophagus and retracting it medially. Then using Kitner swabs to clear soft tissue from the anterior cervical spine. We then inserted a bent spinal needle into the upper exposed intervertebral disc space. We then obtained intraoperative radiographs confirm our location.  I then used electrocautery to detach the medial border of the longus colli muscle bilaterally from the C4-5, C5-6 and C6-7 intervertebral disc spaces. I then inserted the Caspar self-retaining retractor underneath the longus colli muscle bilaterally to provide exposure.  We then incised the intervertebral disc at C4-5. We then performed a partial intervertebral discectomy with a pituitary forceps and the Karlin curettes. I then inserted distraction screws into the vertebral bodies at C4 and C5. We then distracted the interspace. We then used the high-speed drill to decorticate the vertebral endplates at G2-6, to drill away the remainder of the intervertebral disc, to drill away some posterior spondylosis, and to thin out the posterior longitudinal ligament. I then incised ligament with the arachnoid knife. We then removed the ligament with a Kerrison punches undercutting the vertebral endplates and decompressing the thecal sac. We then performed foraminotomies about the bilateral C5 nerve roots. This completed the decompression at this level.  We then repeated this procedure in analogous fashion at C5-6 and C6-7 decompressing the thecal sac and the bilateral C6 and C7 nerve roots.  We now turned our to attention to the interbody fusion. We used the trial spacers to determine the appropriate size for the interbody prosthesis. We then pre-filled prosthesis with a combination of local morcellized autograft bone that we obtained during decompression as well as Kinnex bone graft extender. We then inserted the prosthesis into the distracted interspace at C4-5, C5-6 and C6-7. We then removed the distraction  screws. There was a good  snug fit of the prosthesis in the interspace.  Having completed the fusion we now turned attention to the anterior spinal instrumentation. We used the high-speed drill to drill away some anterior spondylosis at the disc spaces so that the plate lay down flat. We selected the appropriate length titanium anterior cervical plate. We laid it along the anterior aspect of the vertebral bodies from C4-C7. We then drilled 14 mm holes at C4, C5, C6 and C7. We then secured the plate to the vertebral bodies by placing two 14 mm self-tapping screws at C4, C5, C6 and C7. We then obtained intraoperative radiograph. The demonstrating good position of the instrumentation. We therefore secured the screws the plate the locking each cam. This completed the instrumentation.  We then obtained hemostasis using bipolar electrocautery. We irrigated the wound out with bacitracin solution. We then removed the retractor. We inspected the esophagus for any damage. There was none apparent. We then reapproximated patient's platysmal muscle with interrupted 3-0 Vicryl suture. We then reapproximated the subcutaneous tissue with interrupted 3-0 Vicryl suture. The skin was reapproximated with Steri-Strips and benzoin. The wound was then covered with bacitracin ointment. A sterile dressing was applied. The drapes were removed. Patient was subsequently extubated by the anesthesia team and transported to the post anesthesia care unit in stable condition. All sponge instrument and needle counts were reportedly correct at the end of this case.

## 2018-05-13 NOTE — Anesthesia Procedure Notes (Signed)
Procedure Name: Intubation Date/Time: 05/13/2018 7:13 AM Performed by: Marsa Aris, CRNA Pre-anesthesia Checklist: Patient identified, Emergency Drugs available, Suction available, Patient being monitored and Timeout performed Patient Re-evaluated:Patient Re-evaluated prior to induction Oxygen Delivery Method: Circle system utilized Preoxygenation: Pre-oxygenation with 100% oxygen Induction Type: IV induction Ventilation: Mask ventilation without difficulty Laryngoscope Size: Miller and 2 Grade View: Grade I Tube type: Oral Tube size: 7.5 mm Number of attempts: 1 Airway Equipment and Method: Stylet Placement Confirmation: ETT inserted through vocal cords under direct vision,  positive ETCO2,  CO2 detector and breath sounds checked- equal and bilateral Secured at: 22 cm Tube secured with: Tape Dental Injury: Teeth and Oropharynx as per pre-operative assessment  Comments: C-spine neutrality maintained. Dentition same as pre-op

## 2018-05-13 NOTE — H&P (Signed)
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  He has failed medical management.  He was worked up with a cervical MRI which demonstrated multilevel spondylosis, disc bulges, stenosis, etc.  I discussed the various treatment options with the patient.  He has decided to proceed with surgery.  Past Medical History:  Diagnosis Date  . Atypical atrial flutter ([place])   . Cervical spondylosis with myelopathy and radiculopathy   . Enlarged prostate   . Restless leg syndrome   . Sleep-disordered breathing     Past Surgical History:  Procedure Laterality Date  . APPENDECTOMY    . BACK SURGERY  2009  . ELECTROPHYSIOLOGIC STUDY N/A 04/07/2016   Procedure: A-Flutter Ablation;  Surgeon: Deboraha Sprang, MD;  Location: [facility];  Service: Cardiovascular;  Laterality: N/A;  . HERNIA REPAIR Bilateral    2 left side, 1 on right inguinal  . LUMBAR LAMINECTOMY/DECOMPRESSION MICRODISCECTOMY N/A 02/04/2014   Procedure: Lumbar Three-Four Laminectomy, Lumbar Two Laminotomy;  Surgeon: Ophelia Charter, MD;  Location: [facility];  Service: Neurosurgery;  Laterality: N/A;  Lumbar Three-Four Laminectomy, Lumbar Two Laminotomy  . TONSILLECTOMY      Allergies  Allergen Reactions  . Oysters [Shellfish Allergy] Other (See Comments)    GI upset, just Oyster    Social History   Tobacco Use  . Smoking status: Current Some Day Smoker    Types: Cigars  . Smokeless tobacco: Never Used  . Tobacco comment: cigar occasionally  Substance Use Topics  . Alcohol use: Yes    Comment: Red wine    Family History  Problem Relation Age of Onset  . AAA (abdominal aortic aneurysm) Mother   . Stroke Father   . Atrial fibrillation Father   . Atrial fibrillation Brother    Prior to Admission medications   Medication Sig Start Date End Date Taking? Authorizing Provider  Cholecalciferol (VITAMIN D3 PO) Take 1 capsule by mouth daily.   Yes  [provider]  clonazePAM (KLONOPIN) 1 MG tablet Take 1 mg by mouth at bedtime as needed for anxiety.  03/13/16  Yes [provider]  Cyanocobalamin (VITAMIN B-12 PO) Take 1 tablet by mouth daily.   Yes [provider]  Multiple Vitamins-Minerals (MULTIVITAMIN PO) Take 1 tablet by mouth daily.   Yes [provider]  pramipexole (MIRAPEX) 1 MG tablet Take 1 mg by mouth 2 (two) times daily with a meal.    Yes [provider]  tadalafil (CIALIS) 5 MG tablet Take 5 mg by mouth every morning.    Yes [provider]  tamsulosin (FLOMAX) 0.4 MG CAPS capsule Take 0.8 mg by mouth every morning.   Yes [provider]     Review of Systems  Positive ROS: As above  All other systems have been reviewed and were otherwise negative with the exception of those mentioned in the HPI and as above.  Objective: Vital signs in last 24 hours: Temp:  [97.6 F (36.4 C)] 97.6 F (36.4 C) (07/22 0610) Pulse Rate:  [70] 70 (07/22 0610) Resp:  [18] 18 (07/22 0610) BP: (132)/(79) 132/79 (07/22 0610) SpO2:  [96 %] 96 % (07/22 0610) Weight:  [96.6 kg (213 lb)] 96.6 kg (213 lb) (07/22 0610) Estimated body mass index is 25.93 kg/m as calculated from the following:   Height as of this encounter: 6\' 4"  (1.93 m).   Weight as of this encounter: 96.6 kg (  213 lb).   General Appearance: Alert Head: Normocephalic, without obvious abnormality, atraumatic Eyes: PERRL, conjunctiva/corneas clear, EOM's intact,    Ears: Normal  Throat: Normal  Neck: Supple, Back: unremarkable Lungs: Clear to auscultation bilaterally, respirations unlabored Heart: Regular rate and rhythm, no murmur, rub or gallop Abdomen: Soft, non-tender Extremities: Extremities normal, atraumatic, no cyanosis or edema Skin: unremarkable  NEUROLOGIC:   Mental status: alert and oriented,Motor Exam -he has weakness in his right wrist extensor and hand grip at 4+/5 Sensory Exam - grossly  normal Reflexes:  Coordination - grossly normal Gait - grossly normal Balance - grossly normal Cranial Nerves: I: smell Not tested  II: visual acuity  OS: Normal  OD: Normal   II: visual fields Full to confrontation  II: pupils Equal, round, reactive to light  III,VII: ptosis None  III,IV,VI: extraocular muscles  Full ROM  V: mastication Normal  V: facial light touch sensation  Normal  V,VII: corneal reflex  Present  VII: facial muscle function - upper  Normal  VII: facial muscle function - lower Normal  VIII: hearing Not tested  IX: soft palate elevation  Normal  IX,X: gag reflex Present  XI: trapezius strength  5/5  XI: sternocleidomastoid strength 5/5  XI: neck flexion strength  5/5  XII: tongue strength  Normal    Data Review Lab Results  Component Value Date   WBC 4.7 05/07/2018   HGB 14.6 05/07/2018   HCT 46.1 05/07/2018   MCV 104.8 (H) 05/07/2018   PLT 239 05/07/2018   Lab Results  Component Value Date   NA 142 05/07/2018   K 4.4 05/07/2018   CL 105 05/07/2018   CO2 29 05/07/2018   BUN 13 05/07/2018   CREATININE 1.04 05/07/2018   GLUCOSE 102 (H) 05/07/2018   No results found for: INR, PROTIME  Assessment/Plan: C4-5, C5-6 and C6-7 disc degeneration, spondylosis, stenosis, cervical radiculopathy, cervical myelopathy: I have discussed the situation with the patient.  I have reviewed his imaging studies with him and pointed out the abnormalities.  We have discussed the various treatment options including surgery.  I have described the surgical treatment option of a C4-5, C5-6 and C6-7 anterior cervical discectomy, fusion and plating.  I have shown him surgical models.  I have given him a surgical pamphlet.  We have discussed the risks, benefits, alternatives, expected postoperative course, and likelihood of achieving our goals with surgery.  I have answered all his and his wife's, questions. He has decided to proceed with surgery.   Ophelia Charter 05/13/2018  7:09 AM

## 2018-05-13 NOTE — Anesthesia Preprocedure Evaluation (Addendum)
Anesthesia Evaluation  Patient identified by MRN, date of birth, ID band Patient awake    Reviewed: Allergy & Precautions, NPO status , Patient's Chart, lab work & pertinent test results  Airway Mallampati: I  TM Distance: >3 FB Neck ROM: Full    Dental  (+) Chipped Front left central incisor chipped prior to hospital arrival. :   Pulmonary Current Smoker,    Pulmonary exam normal        Cardiovascular Normal cardiovascular exam     Neuro/Psych    GI/Hepatic   Endo/Other    Renal/GU      Musculoskeletal   Abdominal   Peds  Hematology   Anesthesia Other Findings   Reproductive/Obstetrics                            Anesthesia Physical Anesthesia Plan  ASA: II  Anesthesia Plan: General   Post-op Pain Management:    Induction: Intravenous  PONV Risk Score and Plan: 1 and Ondansetron  Airway Management Planned: Oral ETT  Additional Equipment:   Intra-op Plan:   Post-operative Plan: Extubation in OR  Informed Consent: I have reviewed the patients History and Physical, chart, labs and discussed the procedure including the risks, benefits and alternatives for the proposed anesthesia with the patient or authorized representative who has indicated his/her understanding and acceptance.   Dental Advisory Given  Plan Discussed with: CRNA and Surgeon  Anesthesia Plan Comments:        Anesthesia Quick Evaluation

## 2018-05-13 NOTE — Anesthesia Postprocedure Evaluation (Signed)
Anesthesia Post Note  Patient: Randall Serene, MD  Procedure(s) Performed: ANTERIOR CERVICAL DECOMPRESSION/DISCECTOMY FUSION , INTERBODY PROSTHESIS, PLATE/SCREWS CERVICAL FOUR- CERVICAL FIVE, CERVICAL FIVE- CERVICAL SIX, CERVICAL SIX- CERVICAL SEVEN (N/A Spine Cervical)     Patient location during evaluation: PACU Anesthesia Type: General Level of consciousness: awake and alert Pain management: pain level controlled Vital Signs Assessment: post-procedure vital signs reviewed and stable Respiratory status: spontaneous breathing, nonlabored ventilation, respiratory function stable and patient connected to nasal cannula oxygen Cardiovascular status: blood pressure returned to baseline and stable Postop Assessment: no apparent nausea or vomiting Anesthetic complications: no    Last Vitals:  Vitals:   05/13/18 1254 05/13/18 1529  BP: 128/72 125/72  Pulse: 83 80  Resp: 19 18  Temp: 36.9 C 37 C  SpO2: 97% 98%    Last Pain:  Vitals:   05/13/18 1548  TempSrc:   PainSc: 7                  Sharla Tankard DAVID

## 2018-05-14 MED ORDER — CYCLOBENZAPRINE HCL 10 MG PO TABS: 10.0000 mg | ORAL_TABLET | Freq: Three times a day (TID) | ORAL | 1 refills | Status: AC | PRN

## 2018-05-14 MED ORDER — OXYCODONE HCL 5 MG PO TABS: 5.0000 mg | ORAL_TABLET | ORAL | 0 refills | Status: AC | PRN

## 2018-05-14 MED ORDER — DOCUSATE SODIUM 100 MG PO CAPS: 100.0000 mg | ORAL_CAPSULE | Freq: Two times a day (BID) | ORAL | 0 refills | Status: AC

## 2018-05-14 NOTE — Discharge Summary (Signed)
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  Admit date: 05/13/2018 Discharge date: 05/14/2018  Admission Diagnoses: C4-5, C5-6 and C6-7 disc degeneration, spondylosis, stenosis, cervical radiculopathy, cervical myelopathy, cervicalgia  Discharge Diagnoses: The same   Active Problems:   Cervical spondylosis with myelopathy and radiculopathy The same  Discharged Condition: good  Hospital Course: I performed a C4-5, C5-6 and C6-7 anterior cervical discectomy, fusion and plating on the patient on 05/13/2018.  The surgery went well.  [name] postoperative course was unremarkable.  On postoperative day 1 he requested discharge home.  He was given written and oral discharge instructions.  All his questions were answered.  Consults: None Significant Diagnostic Studies: None Treatments: C4-5, C5-6 and C6-7 anterior cervical discectomy, fusion and plating. Discharge Exam: Blood pressure (!) 123/91, pulse 67, temperature 97.6 F (36.4 C), temperature source Oral, resp. rate 16, height 6\' 4"  (1.93 m), weight 96.6 kg (213 lb), SpO2 100 %. The patient is alert and pleasant.  He looks well.  His dressing has a small bloodstain.  There is no hematoma or shift.  His strength is normal in all 4 extremities.  Disposition: Home  Discharge Instructions    Call MD for:  difficulty breathing, headache or visual disturbances   Complete by:  As directed    Call MD for:  extreme fatigue   Complete by:  As directed    Call MD for:  hives   Complete by:  As directed    Call MD for:  persistant dizziness or light-headedness   Complete by:  As directed    Call MD for:  persistant nausea and vomiting   Complete by:  As directed    Call MD for:  redness, tenderness, or signs of infection (pain, swelling, redness, odor or green/yellow discharge around incision site)   Complete by:  As directed    Call MD for:  severe uncontrolled pain    Complete by:  As directed    Call MD for:  temperature >100.4   Complete by:  As directed    Diet - low sodium heart healthy   Complete by:  As directed    Discharge instructions   Complete by:  As directed    Call [phone] for a followup appointment. Take a stool softener while you are using pain medications.   Driving Restrictions   Complete by:  As directed    Do not drive for 2 weeks.   Increase activity slowly   Complete by:  As directed    Lifting restrictions   Complete by:  As directed    Do not lift more than 5 pounds. No excessive bending or twisting.   May shower / Bathe   Complete by:  As directed    Remove the dressing for 3 days after surgery.  You may shower, but leave the incision alone.   Remove dressing in 48 hours   Complete by:  As directed    Your stitches are under the scan and will dissolve by themselves. The Steri-Strips will fall off after you take a few showers. Do not rub back or pick at the wound, Leave the wound alone.     Allergies as of 05/14/2018      Reactions   Oysters [shellfish Allergy] Other (See Comments)   GI upset, just Oyster      Medication List    TAKE these medications   CIALIS 5 MG tablet Generic drug:  tadalafil Take 5 mg by mouth every morning.   clonazePAM 1 MG tablet Commonly known as:  KLONOPIN Take 1 mg by mouth at bedtime as needed for anxiety.   cyclobenzaprine 10 MG tablet Commonly known as:  FLEXERIL Take 1 tablet (10 mg total) by mouth 3 (three) times daily as needed for muscle spasms.   docusate sodium 100 MG capsule Commonly known as:  COLACE Take 1 capsule (100 mg total) by mouth 2 (two) times daily.   MULTIVITAMIN PO Take 1 tablet by mouth daily.   oxyCODONE 5 MG immediate release tablet Commonly known as:  Oxy IR/ROXICODONE Take 1 tablet (5 mg total) by mouth every 4 (four) hours as needed for moderate pain ((score 4 to 6)).   pramipexole 1 MG tablet Commonly known as:  MIRAPEX Take 1 mg by  mouth 2 (two) times daily with a meal.   tamsulosin 0.4 MG Caps capsule Commonly known as:  FLOMAX Take 0.8 mg by mouth every morning.   VITAMIN B-12 PO Take 1 tablet by mouth daily.   VITAMIN D3 PO Take 1 capsule by mouth daily.        Signed: Ophelia Charter 05/14/2018, 7:30 AM

## 2018-05-14 NOTE — Evaluation (Signed)
Occupational Therapy Evaluation and Discharge Patient Details Name: Randall ABDALLAH, MD MRN: [id number] DOB: 11-10-1955 Today's Date: 05/14/2018    History of Present Illness Pt is a 62 y.o. male s/p C4-C7 ACDF. PMHx: Atypical atrial flutter, Restless leg syndrome, Back sx 2009, 2015.    Clinical Impression   Pt reports he was independent with ADL PTA. Currently pt mod I with ADL and functional mobility. All neck, safety, and ADL education completed with pt. Pt with residual R wrist weakness with extension (3+/5); pt reports improvements since sx; recommend f/u with MD and potential for outpatient therapy if not progressing. Pt planning to d/c home with 24/7 supervision from family. No further acute OT needs identified; signing off at this time. Please re-consult if needs change. Thank you for this referral.     Follow Up Recommendations  No OT follow up;Supervision - Intermittent    Equipment Recommendations  None recommended by OT    Recommendations for Other Services       Precautions / Restrictions Precautions Precautions: Cervical Precaution Booklet Issued: Yes (comment) Precaution Comments: Reviewed cervical precautions with pt Required Braces or Orthoses: Cervical Brace Cervical Brace: Hard collar Restrictions Weight Bearing Restrictions: No      Mobility Bed Mobility Overal bed mobility: Modified Independent             General bed mobility comments: Increased time, good log roll technique. HOB flat without assist  Transfers Overall transfer level: Modified independent Equipment used: None                  Balance Overall balance assessment: No apparent balance deficits (not formally assessed)                                         ADL either performed or assessed with clinical judgement   ADL Overall ADL's : Modified independent                                       General ADL Comments: Educated pt on  maintaining precauitons during functional activities, log roll for bed mobility, frequent mobility thorughout the day, brace management and wear schedule. Pt able to perform ADL with mod I this session.     Vision         Perception     Praxis      Pertinent Vitals/Pain Pain Assessment: Faces Faces Pain Scale: Hurts a little bit Pain Location: neck Pain Descriptors / Indicators: Discomfort;Sore Pain Intervention(s): Monitored during session     Hand Dominance Right   Extremity/Trunk Assessment Upper Extremity Assessment Upper Extremity Assessment: RUE deficits/detail RUE Deficits / Details: Wrist extension 3+/5. Digit, elbow, shoulder strength/ROM [facility]. No sensation deficits.   Lower Extremity Assessment Lower Extremity Assessment: Overall WFL for tasks assessed   Cervical / Trunk Assessment Cervical / Trunk Assessment: Other exceptions Cervical / Trunk Exceptions: s/p ACDF   Communication Communication Communication: No difficulties   Cognition Arousal/Alertness: Awake/alert Behavior During Therapy: WFL for tasks assessed/performed Overall Cognitive Status: Within Functional Limits for tasks assessed                                     General Comments  Exercises     Shoulder Instructions      Home Living Family/patient expects to be discharged to:: Private residence Living Arrangements: Spouse/significant other Available Help at Discharge: Family;Available 24 hours/day Type of Home: House       Home Layout: Two level;Bed/bath upstairs     Bathroom Shower/Tub: Occupational psychologist: Standard     Home Equipment: None          Prior Functioning/Environment Level of Independence: Independent                 OT Problem List: Decreased strength;Decreased knowledge of precautions;Pain      OT Treatment/Interventions:      OT Goals(Current goals can be found in the care plan section) Acute Rehab OT  Goals Patient Stated Goal: get strength back in RUE OT Goal Formulation: All assessment and education complete, DC therapy  OT Frequency:     Barriers to D/C:            Co-evaluation              AM-PAC PT "6 Clicks" Daily Activity     Outcome Measure Help from another person eating meals?: None Help from another person taking care of personal grooming?: None Help from another person toileting, which includes using toliet, bedpan, or urinal?: None Help from another person bathing (including washing, rinsing, drying)?: None Help from another person to put on and taking off regular upper body clothing?: None Help from another person to put on and taking off regular lower body clothing?: None 6 Click Score: 24   End of Session Equipment Utilized During Treatment: Cervical collar Nurse Communication: Mobility status;Other (comment)(no equipment or f/u needs)  Activity Tolerance: Patient tolerated treatment well Patient left: in bed;with call bell/phone within reach  OT Visit Diagnosis: Pain Pain - part of body: (neck)                Time: 5885-0277 OT Time Calculation (min): 11 min Charges:  OT General Charges $OT Visit: 1 Visit OT Evaluation $OT Eval Low Complexity: 1 Low G-Codes:     Tyshea Imel A. Ulice Brilliant, M.S., OTR/L Acute Rehab Department: [phone]  Binnie Kand 05/14/2018, 9:47 AM

## 2018-05-14 NOTE — Progress Notes (Signed)
Patient alert and oriented, mae's well, voiding adequate amount of urine, swallowing without difficulty, no c/o pain at time of discharge. Patient discharged home with family. Script and discharged instructions given to patient. Patient and family stated understanding of instructions given. Patient has an appointment with Dr. Jenkins   

## 2018-05-15 ENCOUNTER — Other Ambulatory Visit: Payer: Self-pay | Admitting: *Deleted

## 2018-05-15 NOTE — Patient Outreach (Signed)
[place] [facility]) Care Management  05/15/2018  Rocco Serene, MD [birth year] [id number]   Initial attempt to reach patient via home number to complete transition of care assessment.No answer and not able to leave voice mail. 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 Arnoldo Morale. He was discharged to home on 05/14/18.  Will attempt to reach patient via mobile number.  Barrington Ellison RN,CCM,CDE [place] Management Coordinator Office Phone [phone] Office Fax [phone]

## 2018-05-16 ENCOUNTER — Other Ambulatory Visit: Payer: Self-pay | Admitting: *Deleted

## 2018-05-16 NOTE — Patient Outreach (Addendum)
[place]) Care Management  05/16/2018  Rocco Serene, MD December 29, 1955 [id number]   Second telephone outreach successful via mobile number and transition of care assessment completed. See transition of care template for details.  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 Arnoldo Morale. He was discharged to home on 05/14/18.  No care management needs identified so will close case to [place] Management services and mail successful outreach letter with [place] Management information brochure to patient's home address.  Barrington Ellison RN,CCM,CDE [place] Management Coordinator Office Phone [phone] Office Fax [phone]

## 2018-05-17 ENCOUNTER — Encounter (HOSPITAL_COMMUNITY): Payer: Self-pay | Admitting: Neurosurgery

## 2018-05-20 ENCOUNTER — Other Ambulatory Visit (HOSPITAL_COMMUNITY): Payer: 59

## 2018-06-07 DIAGNOSIS — R030 Elevated blood-pressure reading, without diagnosis of hypertension: Secondary | ICD-10-CM | POA: Diagnosis not present

## 2018-06-07 DIAGNOSIS — M4712 Other spondylosis with myelopathy, cervical region: Secondary | ICD-10-CM | POA: Diagnosis not present

## 2018-06-12 DIAGNOSIS — D2261 Melanocytic nevi of right upper limb, including shoulder: Secondary | ICD-10-CM | POA: Diagnosis not present

## 2018-06-12 DIAGNOSIS — D2262 Melanocytic nevi of left upper limb, including shoulder: Secondary | ICD-10-CM | POA: Diagnosis not present

## 2018-06-12 DIAGNOSIS — Z08 Encounter for follow-up examination after completed treatment for malignant neoplasm: Secondary | ICD-10-CM | POA: Diagnosis not present

## 2018-06-12 DIAGNOSIS — D2272 Melanocytic nevi of left lower limb, including hip: Secondary | ICD-10-CM | POA: Diagnosis not present

## 2018-06-12 DIAGNOSIS — D225 Melanocytic nevi of trunk: Secondary | ICD-10-CM | POA: Diagnosis not present

## 2018-06-12 DIAGNOSIS — D2271 Melanocytic nevi of right lower limb, including hip: Secondary | ICD-10-CM | POA: Diagnosis not present

## 2018-06-12 DIAGNOSIS — L570 Actinic keratosis: Secondary | ICD-10-CM | POA: Diagnosis not present

## 2018-06-12 DIAGNOSIS — Z85828 Personal history of other malignant neoplasm of skin: Secondary | ICD-10-CM | POA: Diagnosis not present

## 2018-06-12 DIAGNOSIS — X32XXXA Exposure to sunlight, initial encounter: Secondary | ICD-10-CM | POA: Diagnosis not present

## 2018-06-18 DIAGNOSIS — H5213 Myopia, bilateral: Secondary | ICD-10-CM | POA: Diagnosis not present

## 2018-06-18 DIAGNOSIS — H52223 Regular astigmatism, bilateral: Secondary | ICD-10-CM | POA: Diagnosis not present

## 2018-06-18 DIAGNOSIS — H524 Presbyopia: Secondary | ICD-10-CM | POA: Diagnosis not present

## 2018-08-27 DIAGNOSIS — Z6827 Body mass index (BMI) 27.0-27.9, adult: Secondary | ICD-10-CM | POA: Diagnosis not present

## 2018-08-27 DIAGNOSIS — R030 Elevated blood-pressure reading, without diagnosis of hypertension: Secondary | ICD-10-CM | POA: Diagnosis not present

## 2018-08-27 DIAGNOSIS — M4712 Other spondylosis with myelopathy, cervical region: Secondary | ICD-10-CM | POA: Diagnosis not present

## 2018-11-18 ENCOUNTER — Other Ambulatory Visit: Payer: Self-pay

## 2018-11-18 ENCOUNTER — Encounter: Payer: Self-pay | Admitting: Physical Therapy

## 2018-11-18 ENCOUNTER — Ambulatory Visit: Payer: 59 | Attending: Internal Medicine | Admitting: Physical Therapy

## 2018-11-18 DIAGNOSIS — M6281 Muscle weakness (generalized): Secondary | ICD-10-CM | POA: Diagnosis not present

## 2018-11-18 DIAGNOSIS — M62838 Other muscle spasm: Secondary | ICD-10-CM | POA: Insufficient documentation

## 2018-11-18 DIAGNOSIS — G8929 Other chronic pain: Secondary | ICD-10-CM | POA: Diagnosis not present

## 2018-11-18 DIAGNOSIS — M25611 Stiffness of right shoulder, not elsewhere classified: Secondary | ICD-10-CM | POA: Diagnosis not present

## 2018-11-18 DIAGNOSIS — M5442 Lumbago with sciatica, left side: Secondary | ICD-10-CM | POA: Diagnosis not present

## 2018-11-18 DIAGNOSIS — M5441 Lumbago with sciatica, right side: Secondary | ICD-10-CM | POA: Insufficient documentation

## 2018-11-18 NOTE — Patient Instructions (Addendum)
Stretches : (Cuing provided for proper alignment)  6 directions   ROM of each joint at neck  on back 5 reps    no lifting of the head      Seated -figure 4 (piriformis)   Hands on the chair, behind, pressing shoulder blades down and together,  5 breaths , chin tuck    Back stretch, hands at dresser/ counter , standing, lengthening the spine 5 breaths 5 reps  During the day     Sitting with feet on the floor, not crossed   Weights:   Shoulder roll back and down,    Exhale on the lift

## 2018-11-20 NOTE — Therapy (Addendum)
[place] MAIN [facility] SERVICES [address] [place], [place], [address] Phone: [phone]   Fax:  [phone]  Physical Therapy Evaluation  Patient Details  Name: Randall HEAD, Randall Shannon MRN: [id number] Date of Birth: 19-Apr-1956 Referring Provider (PT): Dr. Doy Hutching    Encounter Date: 11/18/2018    Past Medical History:  Diagnosis Date  . Atypical atrial flutter ([place])   . Cervical spondylosis with myelopathy and radiculopathy   . Enlarged prostate   . Restless leg syndrome   . Sleep-disordered breathing     Past Surgical History:  Procedure Laterality Date  . ANTERIOR CERVICAL DECOMP/DISCECTOMY FUSION N/A 05/13/2018   Procedure: ANTERIOR CERVICAL DECOMPRESSION/DISCECTOMY FUSION , INTERBODY PROSTHESIS, PLATE/SCREWS CERVICAL FOUR- CERVICAL FIVE, CERVICAL FIVE- CERVICAL SIX, CERVICAL SIX- CERVICAL SEVEN;  Surgeon: Newman Pies, Randall Shannon;  Location: [place];  Service: Neurosurgery;  Laterality: N/A;  . APPENDECTOMY    . BACK SURGERY  2009  . ELECTROPHYSIOLOGIC STUDY N/A 04/07/2016   Procedure: A-Flutter Ablation;  Surgeon: Deboraha Sprang, Randall Shannon;  Location: [place] CV LAB;  Service: Cardiovascular;  Laterality: N/A;  . HERNIA REPAIR Bilateral    2 left side, 1 on right inguinal  . LUMBAR LAMINECTOMY/DECOMPRESSION MICRODISCECTOMY N/A 02/04/2014   Procedure: Lumbar Three-Four Laminectomy, Lumbar Two Laminotomy;  Surgeon: Ophelia Charter, Randall Shannon;  Location: [place] NEURO ORS;  Service: Neurosurgery;  Laterality: N/A;  Lumbar Three-Four Laminectomy, Lumbar Two Laminotomy  . TONSILLECTOMY      There were no vitals filed for this visit.   Subjective Assessment - 11/20/18 1758    Subjective  1) neck/ R arm weakness: After the cervical fusion July 2019, pt notices remaining 50% weakness in the R UE with grabbing a glass of weakness, top of jar, folder, wrist ext/ flexion which impacts flyfishing. Prior to the surgery, pt noticed weakness when grabbing/ lifting  dumbbells 30 # on the 6-7 reps on the R, 14-15 reps on L. Pt did not have any numbness/ tingling. Pt noticed burning in the back neck when laying flat on the back.   Since the surgery , pt has returned to his normal acivities , working out 6days a week. ( cardio: 3 days/ week elliptical 45-50 min elevation of 10-15 but prior surgery elevation at 10 degrees. resistance at max) ( strength 2 x week with treadmill walking:  UE: rowing 190# , chest press in seated position 170#, shoulder abduction-> adduction at 90deg 90-100#, overhead press 40-50# , triceps 160#, biceps 88#,  all seated .  3 sets 10 reps. Pt has no BLE strength training because the last time in 2016 when working with a trainer who started to introduce more LE strengthening on machines and he had stenosis Sx again which included pseudo-claudication ( bilateral radiating pain), limited DF with stair climbing. Pt has not had any of these BLE Sx since stopping BLE workout since 2016.    2) restleg syndrome in either leg: pt has had this Sx most of his life with interrupted sleep for 1.5 hours- 2hours.  Pt is able to fall back asleep. Medication has helped 90% but he is still waking up.  Pt is working on sleep hygiene.         Pertinent History  Hx abdominal surgeries: appendix removal, back surgery L4-5 discetomy 2009 and laminectomy 2016, July 2019 cervical fusion.  Denied changes in bowel and bladder function.   Denied neck / back pain with sleeping.  [facility] PT Assessment - 11/20/18 1758      Assessment   Medical Diagnosis  Neck Pain/Weakness    Referring Provider (PT)  Dr. Doy Hutching       Precautions   Precautions  None      Balance Screen   Has the patient fallen in the past 6 months  No      Coordination   Gross Motor Movements are Fluid and Coordinated  --   delayed multifidis B with hamstring/glut     AROM   Overall AROM Comments  cervical ext35deg,25 deg flex, R sideflexion 35 deg, L 15 deg,  cervical rotation 35 deg  B          Strength   Overall Strength Comments  hand grip strength R: 22, 18, 16 kg . L 42, 40, 42 kg.  R wrist flexion, ext, supination, pronation, finger abduction 3+/5 compared to L  5/5                   Objective measurements completed on examination: See above findings.      [place] Adult PT Treatment/Exercise - 11/20/18 1857      Neuro Re-ed    Neuro Re-ed Details    cued for proper siting posture to maintain spinal curves and minimize forward Shannon posture, and cues for proper technqiue for HEP       Exercises   Exercises  --   see pt instructions          PT Long Term Goals - 11/20/18 1803      PT LONG TERM GOAL #1   Title  Pt will demo wrist ulnar/ radial deviation with supination/ elbow flexion ( simualted fly fishing task) increased strength 3/5 R, L 5/5  in order to fly fish     Time  4    Period  Weeks    Status  New    Target Date  12/16/18      PT LONG TERM GOAL #2   Title  Pt will demo increased cervical flex from 25 deg to > 35 deg, L sideflexion from 15 deg to > 30 deg in order to drive  safely     Baseline  cervical ext35deg,25 deg flex, R sideflexion 35 deg, L 15 deg, cervical rotation 35 deg B      Time  8    Period  Weeks    Status  New    Target Date  01/13/19      PT LONG TERM GOAL #3   Title  hand grip strength to increase from avg of 18.6 kg to > 30 kg in order to grab cups and folders     Baseline  avg of 3 trials R 18.6 kg, L 41.33 L     Time  10    Period  Weeks    Status  New    Target Date  01/27/19      PT LONG TERM GOAL #4   Title  Pt will demo increased mobility with reaching behind back with RUE to level of T10 in order to rach for back pocket      Baseline  R at L1, L at T10    Time  6    Period  Weeks    Status  New    Target Date  01/01/19      PT LONG TERM GOAL #5   Title  Pt will demo no lumbopelvic perturbation with L shoulder scaption/  R hip ext in MMT in order to demo stronger deep core/ thoracolumbar  strength and to progress to weight training for BLE tih less risk for sciatic Sx    Time  10    Period  Weeks    Status  New    Target Date  01/29/19      Additional Long Term Goals   Additional Long Term Goals  Yes      PT LONG TERM GOAL #6   Title  Pt will demo IND with proper alignment and technique with co-activation of thoracolumbar/ lower kinetic chain, deep core mm with weight machines and body weight stengthening in order to minimize risk for injuries     Time  10    Period  Weeks    Status  New    Target Date  01/29/19      PT LONG TERM GOAL #7   Title  Pt will demo IND with flexibility routine / modifications to yoga in order to minimize risk for injuries     Time  10    Period  Weeks    Status  New    Target Date  01/29/19      PT LONG TERM GOAL #8   Title  Pt will decrease his score on PSQI from % to  <%  in order to improve sleep quality     Time  5    Period  Weeks    Status  New    Target Date  12/25/18      PT LONG TERM GOAL  #9   TITLE  Pt will increase his PFSF score ( fly fishing from 7 to >9/10, resistance exercise  from 6 to > 8 /10, fly rod building from 8 to 10/10)     Time  10    Period  Weeks    Status  New    Target Date  01/29/19                    Plan - 11/20/18 1854    Clinical Impression Statement  Pt is a 63 yo male who reports of R UE weakness and R restleg syndrome with poor sleep. These deficits impact his ADLs and ability to perform his hobbies of fly fishing, hiking at high elevation, and weight lifting. Pt's UE Sx occurred with weight lifting which lead to subsequent cervical fusion surgery in July 2019. However, he continues to notice a remaining 50% with R UE weakness since his surgery and he is gradually returning to his weight lifting routine. Pt has not undergone any PT for any of his surgeries ( see below) and he is interested in minimizing risk for injuries, regaining RUE strength, and to integrate back safely to his  hobbies of flyfishing, hiking with his sons, weight lifting, and community yoga classes.   Hx of surgeries that are contributing factors to his Sx: abdominal surgeries: appendix removal, back surgery L4-5 discetomy 2009 and laminectomy 2016, July 2019 cervical fusion, hernial repair.   Pt's clinical presentations include limited  cervical AROM, poor posture ( forward Shannon), increased spinal mm tightness and limited thoracic mobility, weakness of deep core/RUE/ thoracolumbar mm systems, limited shoulder mobility, limited education on proper weight lifting and yoga techniques.   Pt was provided education on etiology of Sx with anatomy, physiology explanation with images along with the benefits of customized pelvic PT Tx based on pt's medical conditions and musculoskeletal deficits.   Following Tx today  which pt demo'd improved sitting posture with cues for more cervical /scapular retraction and demo'd  IND with new HEP which were customized to increased spinal mobility.       Clinical Presentation  Evolving    Clinical Decision Making  Moderate    Rehab Potential  Good    PT Frequency  1x / week    PT Duration  Other (comment)   10   PT Treatment/Interventions  ADLs/Self Care Home Management;Electrical Stimulation;Aquatic Therapy;Moist Heat;Stair training;Therapeutic activities;Therapeutic exercise;Orthotic Fit/Training;Patient/family education;Manual techniques;Scar mobilization;Energy conservation;Taping;Balance training;Neuromuscular re-education    Consulted and Agree with Plan of Care  Patient       Patient will benefit from skilled therapeutic intervention in order to improve the following deficits and impairments:  Decreased endurance, Increased muscle spasms, Postural dysfunction, Increased fascial restricitons, Decreased strength, Decreased coordination, Decreased scar mobility, Hypomobility, Improper body mechanics, Pain, Decreased range of motion, Decreased mobility, Decreased safety  awareness  Visit Diagnosis: Muscle weakness (generalized)  Stiffness of right shoulder, not elsewhere classified  Other muscle spasm  Chronic bilateral low back pain with bilateral sciatica     Problem List Patient Active Problem List   Diagnosis Date Noted  . Cervical spondylosis with myelopathy and radiculopathy 05/13/2018  . Fever and chills 11/16/2017  . Atypical atrial flutter ([place]) 04/07/2016  . Paroxysmal atrial flutter ([place]) 03/27/2016  . BPH with obstruction/lower urinary tract symptoms 07/14/2015  . Disc disease, degenerative, lumbar or lumbosacral 07/14/2015  . RLS (restless legs syndrome) 07/14/2015  . Lumbar stenosis with neurogenic claudication 02/04/2014  . Spinal stenosis of lumbar region 02/04/2014    Jerl Mina 11/20/2018, 7:27 PM  [place] MAIN [facility] SERVICES [address] [place], [place], [address] Phone: [phone]   Fax:  [phone]  Name: Randall GOEDKEN, Randall Shannon MRN: [id number] Date of Birth: 11/25/1955

## 2018-11-25 ENCOUNTER — Ambulatory Visit: Payer: 59 | Attending: Internal Medicine | Admitting: Physical Therapy

## 2018-11-25 DIAGNOSIS — M542 Cervicalgia: Secondary | ICD-10-CM | POA: Insufficient documentation

## 2018-11-25 DIAGNOSIS — M5442 Lumbago with sciatica, left side: Secondary | ICD-10-CM | POA: Diagnosis not present

## 2018-11-25 DIAGNOSIS — M5441 Lumbago with sciatica, right side: Secondary | ICD-10-CM | POA: Insufficient documentation

## 2018-11-25 DIAGNOSIS — M62838 Other muscle spasm: Secondary | ICD-10-CM | POA: Diagnosis not present

## 2018-11-25 DIAGNOSIS — G8929 Other chronic pain: Secondary | ICD-10-CM | POA: Diagnosis not present

## 2018-11-25 DIAGNOSIS — M25611 Stiffness of right shoulder, not elsewhere classified: Secondary | ICD-10-CM | POA: Diagnosis not present

## 2018-11-25 DIAGNOSIS — M6281 Muscle weakness (generalized): Secondary | ICD-10-CM | POA: Insufficient documentation

## 2018-11-25 NOTE — Patient Instructions (Addendum)
Stretches: Neck stretches at night/ morning on bed:   Releasing upper trap:  Tilt head to L shoulder, "shoot  Beams" through fingers pointing towards feet to lower R shoulder away from ears, turn head to look into L armpit   Angel wings up and bat wings down 10 reps    shoulder squeezes down and back with chin tuck J scoop Count aloud 5 sec x 1 0 reps to retrain where the neck needs to be ( in line with sacrum)     ___  Deep core level 1 and 2 * handout   ___  During the day or when fly fishing:  Bear stretch at doorway edge ( mini squat with hips and knees stable) head relaxed   switch sides for releasing behind shoulder blades   Standing version of upper trap/ pect mm  release:     Standing perpendicular to wall, L palm against the wall, temple edge relaxed and touching the wall, look into L armpit and up towards upper corner of the ceiling with chin tucks     5-10 reps   _______  STANDING WITH MORE WEIGHT INTO LEGS, stacking ribs, shoulders, neck like a string is pulling you up

## 2018-11-25 NOTE — Therapy (Signed)
[place] MAIN [facility] SERVICES [address] [place], [place], [address] Phone: [phone]   Fax:  [phone]  Physical Therapy Treatment  Patient Details  Name: Randall BRAME, MD MRN: [id number] Date of Birth: 07-08-1956 Referring Provider (PT): Dr. Doy Hutching    Encounter Date: 11/25/2018  PT End of Session - 11/25/18 1213    Visit Number  2    PT Start Time  1110    PT Stop Time  1210    PT Time Calculation (min)  60 min    Activity Tolerance  Patient tolerated treatment well    Behavior During Therapy  [facility] for tasks assessed/performed       Past Medical History:  Diagnosis Date  . Atypical atrial flutter ([place])   . Cervical spondylosis with myelopathy and radiculopathy   . Enlarged prostate   . Restless leg syndrome   . Sleep-disordered breathing     Past Surgical History:  Procedure Laterality Date  . ANTERIOR CERVICAL DECOMP/DISCECTOMY FUSION N/A 05/13/2018   Procedure: ANTERIOR CERVICAL DECOMPRESSION/DISCECTOMY FUSION , INTERBODY PROSTHESIS, PLATE/SCREWS CERVICAL FOUR- CERVICAL FIVE, CERVICAL FIVE- CERVICAL SIX, CERVICAL SIX- CERVICAL SEVEN;  Surgeon: Newman Pies, MD;  Location: [place];  Service: Neurosurgery;  Laterality: N/A;  . APPENDECTOMY    . BACK SURGERY  2009  . ELECTROPHYSIOLOGIC STUDY N/A 04/07/2016   Procedure: A-Flutter Ablation;  Surgeon: Deboraha Sprang, MD;  Location: [place] CV LAB;  Service: Cardiovascular;  Laterality: N/A;  . HERNIA REPAIR Bilateral    2 left side, 1 on right inguinal  . LUMBAR LAMINECTOMY/DECOMPRESSION MICRODISCECTOMY N/A 02/04/2014   Procedure: Lumbar Three-Four Laminectomy, Lumbar Two Laminotomy;  Surgeon: Ophelia Charter, MD;  Location: [place] NEURO ORS;  Service: Neurosurgery;  Laterality: N/A;  Lumbar Three-Four Laminectomy, Lumbar Two Laminotomy  . TONSILLECTOMY      There were no vitals filed for this visit.  Subjective Assessment - 11/25/18 1153    Subjective  Pt has been doing his  exercises. Went fly fishing over the weekend    Pertinent History  Hx abdominal surgeries: appendix removal, back surgery L4-5 discetomy 2009 and laminectomy 2016, July 2019 cervical fusion.  Denied changes in B & B.  Denied neck / back pain with sleeping.           [facility] PT Assessment - 11/25/18 1155      Palpation   Palpation comment  upper trap B, interspinal mm L > R tightness, hypomobility at upper thoracic, medial scap mm tightness B                     [facility] Adult PT Treatment/Exercise - 11/25/18 1153      Therapeutic Activites    Therapeutic Activities  --   stretches to perform when fly fishing       Neuro Re-ed    Neuro Re-ed Details   cued for cervical /scap retraction/ scap depression, pelvic neutral for less lumbar lodosis, lower kinetic chain activation in standing        Manual Therapy   Manual therapy comments  distraction of occiput, upper thoracic, STM at interspinals with MWM , inferior mob at 1 st to depress upper trap B                   PT Long Term Goals - 11/20/18 1803      PT LONG TERM GOAL #1   Title  Pt will demo wrist ulnar/ radial deviation with  supination/ elbow flexion ( simualted fly fishing task) increased strength 3/5 R, L 5/5  in order to fly fish     Time  4    Period  Weeks    Status  New    Target Date  12/16/18      PT LONG TERM GOAL #2   Title  Pt will demo increased cervical flex from 25 deg to > 35 deg, L sideflexion from 15 deg to > 30 deg in order to drive  safely     Baseline  cervical ext35deg,25 deg flex, R sideflexion 35 deg, L 15 deg, cervical rotation 35 deg B      Time  8    Period  Weeks    Status  New    Target Date  01/13/19      PT LONG TERM GOAL #3   Title  hand grip strength to increase from avg of 18.6 kg to > 30 kg in order to grab cups and folders     Baseline  avg of 3 trials R 18.6 kg, L 41.33 L     Time  10    Period  Weeks    Status  New    Target Date  01/27/19      PT LONG TERM  GOAL #4   Title  Pt will demo increased mobility with reaching behind back with RUE to level of T10 in order to rach for back pocket      Baseline  R at L1, L at T10    Time  6    Period  Weeks    Status  New    Target Date  01/01/19      PT LONG TERM GOAL #5   Title  Pt will demo no lumbopelvic perturbation with L shoulder scaption/ R hip ext in MMT in order to demo stronger deep core/ thoracolumbar strength and to progress to weight training for BLE tih less risk for sciatic Sx    Time  10    Period  Weeks    Status  New    Target Date  01/29/19      Additional Long Term Goals   Additional Long Term Goals  Yes      PT LONG TERM GOAL #6   Title  Pt will demo IND with proper alignment and technique with co-activation of thoracolumbar/ lower kinetic chain, deep core mm with weight machines and body weight stengthening in order to minimize risk for injuries     Time  10    Period  Weeks    Status  New    Target Date  01/29/19      PT LONG TERM GOAL #7   Title  Pt will demo IND with flexibility routine / modifications to yoga in order to minimize risk for injuries     Time  10    Period  Weeks    Status  New    Target Date  01/29/19      PT LONG TERM GOAL #8   Title  Pt will decrease his score on PSQI from % to  <%  in order to improve sleep quality     Time  5    Period  Weeks    Status  New    Target Date  12/25/18      PT LONG TERM GOAL  #9   TITLE  Pt will increase his PFSF score ( fly fishing from 7 to >  9/10, resistance exercise  from 6 to > 8 /10, fly rod building from 8 to 10/10)     Time  10    Period  Weeks    Status  New    Target Date  01/29/19            Plan - 11/25/18 1214    Clinical Impression Statement  After Tx today. Pt demo'd signficantly decreased tightness at interspinal, deep cervical mm with improved mobility in upper thoracic/ cervical sideflexion and rotation. Added stretches and propioception training for more cervical /scapular  retraction/ depression  in supine and standing positions. Added lumbopelvic propioception to minimize lumbar lordosis. Pt demo'd correctly post Tx. Pt continues to benefit from skilled PT     Rehab Potential  Good    PT Frequency  1x / week    PT Duration  Other (comment)   10   PT Treatment/Interventions  ADLs/Self Care Home Management;Electrical Stimulation;Aquatic Therapy;Moist Heat;Stair training;Therapeutic activities;Therapeutic exercise;Orthotic Fit/Training;Patient/family education;Manual techniques;Scar mobilization;Energy conservation;Taping;Balance training;Neuromuscular re-education    Consulted and Agree with Plan of Care  Patient       Patient will benefit from skilled therapeutic intervention in order to improve the following deficits and impairments:  Decreased endurance, Increased muscle spasms, Postural dysfunction, Increased fascial restricitons, Decreased strength, Decreased coordination, Decreased scar mobility, Hypomobility, Improper body mechanics, Pain, Decreased range of motion, Decreased mobility, Decreased safety awareness  Visit Diagnosis: Muscle weakness (generalized)  Stiffness of right shoulder, not elsewhere classified  Other muscle spasm  Chronic bilateral low back pain with bilateral sciatica     Problem List Patient Active Problem List   Diagnosis Date Noted  . Cervical spondylosis with myelopathy and radiculopathy 05/13/2018  . Fever and chills 11/16/2017  . Atypical atrial flutter ([place]) 04/07/2016  . Paroxysmal atrial flutter ([place]) 03/27/2016  . BPH with obstruction/lower urinary tract symptoms 07/14/2015  . Disc disease, degenerative, lumbar or lumbosacral 07/14/2015  . RLS (restless legs syndrome) 07/14/2015  . Lumbar stenosis with neurogenic claudication 02/04/2014  . Spinal stenosis of lumbar region 02/04/2014    Jerl Mina ,PT, DPT, E-RYT  11/25/2018, 12:17 PM  [place] MAIN [facility]  SERVICES [address] [place], [place], [address] Phone: [phone]   Fax:  [phone]  Name: JSON KOELZER, MD MRN: [id number] Date of Birth: 13-Mar-1956

## 2018-12-02 ENCOUNTER — Ambulatory Visit: Payer: 59 | Admitting: Physical Therapy

## 2018-12-02 DIAGNOSIS — M62838 Other muscle spasm: Secondary | ICD-10-CM

## 2018-12-02 DIAGNOSIS — G8929 Other chronic pain: Secondary | ICD-10-CM

## 2018-12-02 DIAGNOSIS — M25611 Stiffness of right shoulder, not elsewhere classified: Secondary | ICD-10-CM

## 2018-12-02 DIAGNOSIS — M5441 Lumbago with sciatica, right side: Secondary | ICD-10-CM | POA: Diagnosis not present

## 2018-12-02 DIAGNOSIS — M5442 Lumbago with sciatica, left side: Secondary | ICD-10-CM

## 2018-12-02 DIAGNOSIS — M6281 Muscle weakness (generalized): Secondary | ICD-10-CM | POA: Diagnosis not present

## 2018-12-02 DIAGNOSIS — M542 Cervicalgia: Secondary | ICD-10-CM | POA: Diagnosis not present

## 2018-12-02 NOTE — Patient Instructions (Addendum)
Added knee to chest with towel under thighs, pull with exhale  Anchor (neck, shoulders and elbows down)   10 reps    _______   Low cobra:  On belly, palms under armpits, elbows pointed to ceiling  Inhale: lengthen crown of the head away from shoulders Exhale, feel belly hug in and press palms into the floor, squeezing elbows/shoulder blades towards each other while chest lifts about 5 cm off of the floor. You should feel the hinging movement at mid back and not the low back.   15 reps

## 2018-12-03 NOTE — Therapy (Signed)
[place] MAIN [facility] SERVICES [address] [place], [place], [address] Phone: [phone]   Fax:  [phone]  Physical Therapy Treatment  Patient Details  Name: Randall SODERHOLM, MD MRN: [id number] Date of Birth: 30-Aug-1956 Referring Provider (PT): Dr. Doy Hutching    Encounter Date: 12/02/2018  PT End of Session - 12/03/18 2055    Visit Number  3    PT Start Time  1007    PT Stop Time  1107    PT Time Calculation (min)  60 min    Activity Tolerance  Patient tolerated treatment well    Behavior During Therapy  [facility] for tasks assessed/performed       Past Medical History:  Diagnosis Date  . Atypical atrial flutter ([place])   . Cervical spondylosis with myelopathy and radiculopathy   . Enlarged prostate   . Restless leg syndrome   . Sleep-disordered breathing     Past Surgical History:  Procedure Laterality Date  . ANTERIOR CERVICAL DECOMP/DISCECTOMY FUSION N/A 05/13/2018   Procedure: ANTERIOR CERVICAL DECOMPRESSION/DISCECTOMY FUSION , INTERBODY PROSTHESIS, PLATE/SCREWS CERVICAL FOUR- CERVICAL FIVE, CERVICAL FIVE- CERVICAL SIX, CERVICAL SIX- CERVICAL SEVEN;  Surgeon: Newman Pies, MD;  Location: [place];  Service: Neurosurgery;  Laterality: N/A;  . APPENDECTOMY    . BACK SURGERY  2009  . ELECTROPHYSIOLOGIC STUDY N/A 04/07/2016   Procedure: A-Flutter Ablation;  Surgeon: Deboraha Sprang, MD;  Location: [place] CV LAB;  Service: Cardiovascular;  Laterality: N/A;  . HERNIA REPAIR Bilateral    2 left side, 1 on right inguinal  . LUMBAR LAMINECTOMY/DECOMPRESSION MICRODISCECTOMY N/A 02/04/2014   Procedure: Lumbar Three-Four Laminectomy, Lumbar Two Laminotomy;  Surgeon: Ophelia Charter, MD;  Location: [place] NEURO ORS;  Service: Neurosurgery;  Laterality: N/A;  Lumbar Three-Four Laminectomy, Lumbar Two Laminotomy  . TONSILLECTOMY      There were no vitals filed for this visit.  Subjective Assessment - 12/03/18 2100    Subjective  Pt notices his  exercise tolerance has improved as he noticed his breathing technique.     Pertinent History  Hx abdominal surgeries: appendix removal, back surgery L4-5 discetomy 2009 and laminectomy 2016, July 2019 cervical fusion.  Denied changes in B & B.  Denied neck / back pain with sleeping.           [facility] PT Assessment - 12/03/18 2049      AROM   Overall AROM Comments  cervical sidebend R 30 deg, L 35 deg ,   flexion 25 deg, ext 50 deg ,  rotation R 60 deg, L 50 deg         Strength   Overall Strength Comments  pronation/supination/wrist ext/flexion  on R 4-/5      Palpation   Spinal mobility  R T4-7 hypomobile, tightness at rhombhoids, paraspinals B     Palpation comment  signficantly less upper trap tightness.                     [place] Adult PT Treatment/Exercise - 12/03/18 2050      Neuro Re-ed    Neuro Re-ed Details   see pt instructions,       Exercises   Exercises  --   see pt instructions      Manual Therapy   Manual therapy comments  STM / MWM at areas noted in assessment                   PT Long Term  Goals - 11/20/18 1803      PT LONG TERM GOAL #1   Title  Pt will demo wrist ulnar/ radial deviation with supination/ elbow flexion ( simualted fly fishing task) increased strength 3/5 R, L 5/5  in order to fly fish     Time  4    Period  Weeks    Status  New    Target Date  12/16/18      PT LONG TERM GOAL #2   Title  Pt will demo increased cervical flex from 25 deg to > 35 deg, L sideflexion from 15 deg to > 30 deg in order to drive  safely     Baseline  cervical ext35deg,25 deg flex, R sideflexion 35 deg, L 15 deg, cervical rotation 35 deg B      Time  8    Period  Weeks    Status  New    Target Date  01/13/19      PT LONG TERM GOAL #3   Title  hand grip strength to increase from avg of 18.6 kg to > 30 kg in order to grab cups and folders     Baseline  avg of 3 trials R 18.6 kg, L 41.33 L     Time  10    Period  Weeks    Status  New     Target Date  01/27/19      PT LONG TERM GOAL #4   Title  Pt will demo increased mobility with reaching behind back with RUE to level of T10 in order to rach for back pocket      Baseline  R at L1, L at T10    Time  6    Period  Weeks    Status  New    Target Date  01/01/19      PT LONG TERM GOAL #5   Title  Pt will demo no lumbopelvic perturbation with L shoulder scaption/ R hip ext in MMT in order to demo stronger deep core/ thoracolumbar strength and to progress to weight training for BLE tih less risk for sciatic Sx    Time  10    Period  Weeks    Status  New    Target Date  01/29/19      Additional Long Term Goals   Additional Long Term Goals  Yes      PT LONG TERM GOAL #6   Title  Pt will demo IND with proper alignment and technique with co-activation of thoracolumbar/ lower kinetic chain, deep core mm with weight machines and body weight stengthening in order to minimize risk for injuries     Time  10    Period  Weeks    Status  New    Target Date  01/29/19      PT LONG TERM GOAL #7   Title  Pt will demo IND with flexibility routine / modifications to yoga in order to minimize risk for injuries     Time  10    Period  Weeks    Status  New    Target Date  01/29/19      PT LONG TERM GOAL #8   Title  Pt will decrease his score on PSQI from % to  <%  in order to improve sleep quality     Time  5    Period  Weeks    Status  New    Target Date  12/25/18  PT LONG TERM GOAL  #9   TITLE  Pt will increase his PFSF score ( fly fishing from 7 to >9/10, resistance exercise  from 6 to > 8 /10, fly rod building from 8 to 10/10)     Time  10    Period  Weeks    Status  New    Target Date  01/29/19            Plan - 12/03/18 2056    Clinical Impression Statement  Pt  showed decreased upper trap tightness and regained signficant cervical AROM from last session. Pt's wrist flexion/ ext/ finger abduction strength also increased. Addressed lower scapular region tightness  today and initiated scapular depression/ retraction, cervical retraction propioception training .  Pt continues to benefit from skilled PT.     Rehab Potential  Good    PT Frequency  1x / week    PT Duration  Other (comment)   10   PT Treatment/Interventions  ADLs/Self Care Home Management;Electrical Stimulation;Aquatic Therapy;Moist Heat;Stair training;Therapeutic activities;Therapeutic exercise;Orthotic Fit/Training;Patient/family education;Manual techniques;Scar mobilization;Energy conservation;Taping;Balance training;Neuromuscular re-education    Consulted and Agree with Plan of Care  Patient       Patient will benefit from skilled therapeutic intervention in order to improve the following deficits and impairments:  Decreased endurance, Increased muscle spasms, Postural dysfunction, Increased fascial restricitons, Decreased strength, Decreased coordination, Decreased scar mobility, Hypomobility, Improper body mechanics, Pain, Decreased range of motion, Decreased mobility, Decreased safety awareness  Visit Diagnosis: Muscle weakness (generalized)  Stiffness of right shoulder, not elsewhere classified  Other muscle spasm  Chronic bilateral low back pain with bilateral sciatica     Problem List Patient Active Problem List   Diagnosis Date Noted  . Cervical spondylosis with myelopathy and radiculopathy 05/13/2018  . Fever and chills 11/16/2017  . Atypical atrial flutter ([place]) 04/07/2016  . Paroxysmal atrial flutter ([place]) 03/27/2016  . BPH with obstruction/lower urinary tract symptoms 07/14/2015  . Disc disease, degenerative, lumbar or lumbosacral 07/14/2015  . RLS (restless legs syndrome) 07/14/2015  . Lumbar stenosis with neurogenic claudication 02/04/2014  . Spinal stenosis of lumbar region 02/04/2014    Jerl Mina ,PT, DPT, E-RYT  12/03/2018, 9:00 PM  [place] MAIN [facility] SERVICES [address] [place], [place],  [address] Phone: [phone]   Fax:  [phone]  Name: COLTON TASSIN, MD MRN: [id number] Date of Birth: [birth year]

## 2018-12-09 ENCOUNTER — Ambulatory Visit: Payer: 59 | Admitting: Physical Therapy

## 2018-12-09 DIAGNOSIS — M62838 Other muscle spasm: Secondary | ICD-10-CM

## 2018-12-09 DIAGNOSIS — M5442 Lumbago with sciatica, left side: Secondary | ICD-10-CM

## 2018-12-09 DIAGNOSIS — M542 Cervicalgia: Secondary | ICD-10-CM | POA: Diagnosis not present

## 2018-12-09 DIAGNOSIS — M25611 Stiffness of right shoulder, not elsewhere classified: Secondary | ICD-10-CM | POA: Diagnosis not present

## 2018-12-09 DIAGNOSIS — M5441 Lumbago with sciatica, right side: Secondary | ICD-10-CM | POA: Diagnosis not present

## 2018-12-09 DIAGNOSIS — G8929 Other chronic pain: Secondary | ICD-10-CM

## 2018-12-09 DIAGNOSIS — M6281 Muscle weakness (generalized): Secondary | ICD-10-CM | POA: Diagnosis not present

## 2018-12-09 NOTE — Patient Instructions (Addendum)
Forearm, wrists, and finger stretches:  ( AFTER WEIGHT LIFTING)   Post weight lifting stretches   1)  Elbows straight,thumbs down, cross wrists over Pull forward and back spreading shoulder blades apart Bring knuckles by your chest and end with them under chin  Reverse 5 reps  And switch to the other side   2)  Start with prayer palm at the chest , fingers spread Bring arms up overhead, elbows straight, lower to chest, spread wrists apart, then fingers pads to extend fingers   5 reps    ___   Add in the morning to 6 direction of spine:  chin up, shoulders down, wiggle jaw side to side 5 reps   TO COMPLIMENT THE RPEATED R ROTATION WITH FLY FISHING  Add in the morning to bat wings:  Lengthening and strengthening L,  Hooking hand on mattress, pull elbow away, open elbow to the L and feel the ribs rotate to the L small degree  Add to fly flishing breaks:  modified thread the needle to the L ( R hand on Lthigh, pulling elbow to the R, thigh out, looking under L armpit to feel the ribs rotation)

## 2018-12-10 NOTE — Therapy (Signed)
[place] MAIN [facility] SERVICES [address] [place], [place], [address] Phone: [phone]   Fax:  [phone]  Physical Therapy Treatment  Patient Details  Name: Randall CROCHET, Randall Shannon MRN: [id number] Date of Birth: 1956-08-14 Referring Provider (PT): Dr. Doy Hutching    Encounter Date: 12/09/2018  PT End of Session - 12/09/18 1010    Visit Number  4    PT Start Time  1010    Activity Tolerance  Patient tolerated treatment well    Behavior During Therapy  [facility] for tasks assessed/performed       Past Medical History:  Diagnosis Date  . Atypical atrial flutter ([place])   . Cervical spondylosis with myelopathy and radiculopathy   . Enlarged prostate   . Restless leg syndrome   . Sleep-disordered breathing     Past Surgical History:  Procedure Laterality Date  . ANTERIOR CERVICAL DECOMP/DISCECTOMY FUSION N/A 05/13/2018   Procedure: ANTERIOR CERVICAL DECOMPRESSION/DISCECTOMY FUSION , INTERBODY PROSTHESIS, PLATE/SCREWS CERVICAL FOUR- CERVICAL FIVE, CERVICAL FIVE- CERVICAL SIX, CERVICAL SIX- CERVICAL SEVEN;  Surgeon: Newman Pies, Randall Shannon;  Location: [place];  Service: Neurosurgery;  Laterality: N/A;  . APPENDECTOMY    . BACK SURGERY  2009  . ELECTROPHYSIOLOGIC STUDY N/A 04/07/2016   Procedure: A-Flutter Ablation;  Surgeon: Deboraha Sprang, Randall Shannon;  Location: [place] CV LAB;  Service: Cardiovascular;  Laterality: N/A;  . HERNIA REPAIR Bilateral    2 left side, 1 on right inguinal  . LUMBAR LAMINECTOMY/DECOMPRESSION MICRODISCECTOMY N/A 02/04/2014   Procedure: Lumbar Three-Four Laminectomy, Lumbar Two Laminotomy;  Surgeon: Ophelia Charter, Randall Shannon;  Location: [place] [facility];  Service: Neurosurgery;  Laterality: N/A;  Lumbar Three-Four Laminectomy, Lumbar Two Laminotomy  . TONSILLECTOMY      There were no vitals filed for this visit.  Subjective Assessment - 12/09/18 1009    Subjective  Pt feels sore in the L shoulder area if he puts his shoulders back and laying on  his back and rolling over to L / R, and also with sidebend L at the neck.  Grabbing and holding things is feeling stronger.     Pertinent History  Hx abdominal surgeries: appendix removal, back surgery L4-5 discetomy 2009 and laminectomy 2016, July 2019 cervical fusion.  Denied changes in B & B.  Denied neck / back pain with sleeping.           [facility] PT Assessment - 12/10/18 1155      AROM   Overall AROM Comments  cervical ext/flex 45 deg, sideflexion L 35 deg, R 50 deg,  rotation 60 deg R, 58 L deg   ( post Tx: sideflexion 35 deg L    No change)   supine: rotation R 60, L 65deg pre Tx, postTx  70 deg B      Strength   Overall Strength Comments  prontation/supination/wrist ext/flexion finger abd  on R 5/5, L  4-/5        Palpation   Spinal mobility  T4-7 L interspinal tightness/ SP  slightly deviated to L.  rhomoboid/ posterior serratus, low trap on L, posterior intercostals tightness limited in L rotation                    [facility] Adult PT Treatment/Exercise - 12/10/18 1154      Neuro Re-ed    Neuro Re-ed Details   --      Exercises   Exercises  --   see pt instructions  Manual Therapy   Manual therapy comments  STM / MWM, PA mob Grade III at T4-7, medial glide and inferior glides at 1st rib at areas noted in assessment    focused on L side                  PT Long Term Goals - 11/20/18 1803      PT LONG TERM GOAL #1   Title  Pt will demo wrist ulnar/ radial deviation with supination/ elbow flexion ( simualted fly fishing task) increased strength 3/5 R, L 5/5  in order to fly fish     Time  4    Period  Weeks    Status  New    Target Date  12/16/18      PT LONG TERM GOAL #2   Title  Pt will demo increased cervical flex from 25 deg to > 35 deg, L sideflexion from 15 deg to > 30 deg in order to drive  safely     Baseline  cervical ext35deg,25 deg flex, R sideflexion 35 deg, L 15 deg, cervical rotation 35 deg B      Time  8    Period  Weeks     Status  New    Target Date  01/13/19      PT LONG TERM GOAL #3   Title  hand grip strength to increase from avg of 18.6 kg to > 30 kg in order to grab cups and folders     Baseline  avg of 3 trials R 18.6 kg, L 41.33 L     Time  10    Period  Weeks    Status  New    Target Date  01/27/19      PT LONG TERM GOAL #4   Title  Pt will demo increased mobility with reaching behind back with RUE to level of T10 in order to rach for back pocket      Baseline  R at L1, L at T10    Time  6    Period  Weeks    Status  New    Target Date  01/01/19      PT LONG TERM GOAL #5   Title  Pt will demo no lumbopelvic perturbation with L shoulder scaption/ R hip ext in MMT in order to demo stronger deep core/ thoracolumbar strength and to progress to weight training for BLE tih less risk for sciatic Sx    Time  10    Period  Weeks    Status  New    Target Date  01/29/19      Additional Long Term Goals   Additional Long Term Goals  Yes      PT LONG TERM GOAL #6   Title  Pt will demo IND with proper alignment and technique with co-activation of thoracolumbar/ lower kinetic chain, deep core mm with weight machines and body weight stengthening in order to minimize risk for injuries     Time  10    Period  Weeks    Status  New    Target Date  01/29/19      PT LONG TERM GOAL #7   Title  Pt will demo IND with flexibility routine / modifications to yoga in order to minimize risk for injuries     Time  10    Period  Weeks    Status  New    Target Date  01/29/19  PT LONG TERM GOAL #8   Title  Pt will decrease his score on PSQI from % to  <%  in order to improve sleep quality     Time  5    Period  Weeks    Status  New    Target Date  12/25/18      PT LONG TERM GOAL  #9   TITLE  Pt will increase his PFSF score ( fly fishing from 7 to >9/10, resistance exercise  from 6 to > 8 /10, fly rod building from 8 to 10/10)     Time  10    Period  Weeks    Status  New    Target Date  01/29/19             Plan - 12/10/18 1150    Clinical Impression Statement  Pt demonstrates increased cervical AROM but still has no change in L sideflexion.  Today addressed L posterior thoracic mm tightness with manual Tx  and provided complimentary stretches to counteract the repeated R trunk rotation involved with his fly fishing hobby.  Pt demonstrated increased L rotation throacic mobility post Tx and anticipate this mobility will help with increasing L sideflexion. Plan to continue addressing limitation of L sideflexion at next session and go over body mechanics with lifting luggage as pt will be planning to travel next month. Add cross digaonal UE strengthening to achieve proper lifting of luggage to minimize risk for injury to back and neck. Pt continues to benefit from skilled PT.      Rehab Potential  Good    PT Frequency  1x / week    PT Duration  Other (comment)   10   PT Treatment/Interventions  ADLs/Self Care Home Management;Electrical Stimulation;Aquatic Therapy;Moist Heat;Stair training;Therapeutic activities;Therapeutic exercise;Orthotic Fit/Training;Patient/family education;Manual techniques;Scar mobilization;Energy conservation;Taping;Balance training;Neuromuscular re-education    Consulted and Agree with Plan of Care  Patient       Patient will benefit from skilled therapeutic intervention in order to improve the following deficits and impairments:  Decreased endurance, Increased muscle spasms, Postural dysfunction, Increased fascial restricitons, Decreased strength, Decreased coordination, Decreased scar mobility, Hypomobility, Improper body mechanics, Pain, Decreased range of motion, Decreased mobility, Decreased safety awareness  Visit Diagnosis: Muscle weakness (generalized)  Stiffness of right shoulder, not elsewhere classified  Other muscle spasm  Chronic bilateral low back pain with bilateral sciatica     Problem List Patient Active Problem List   Diagnosis Date  Noted  . Cervical spondylosis with myelopathy and radiculopathy 05/13/2018  . Fever and chills 11/16/2017  . Atypical atrial flutter ([place]) 04/07/2016  . Paroxysmal atrial flutter ([place]) 03/27/2016  . BPH with obstruction/lower urinary tract symptoms 07/14/2015  . Disc disease, degenerative, lumbar or lumbosacral 07/14/2015  . RLS (restless legs syndrome) 07/14/2015  . Lumbar stenosis with neurogenic claudication 02/04/2014  . Spinal stenosis of lumbar region 02/04/2014    Jerl Mina ,PT, DPT, E-RYT  12/10/2018, 12:01 PM  [place] MAIN [facility] SERVICES [address] [place], [place], [address] Phone: [phone]   Fax:  [phone]  Name: Randall MALAND, Randall Shannon MRN: [id number] Date of Birth: 05/20/1956

## 2018-12-16 ENCOUNTER — Ambulatory Visit: Payer: 59 | Admitting: Physical Therapy

## 2018-12-16 DIAGNOSIS — M5441 Lumbago with sciatica, right side: Secondary | ICD-10-CM

## 2018-12-16 DIAGNOSIS — G8929 Other chronic pain: Secondary | ICD-10-CM | POA: Diagnosis not present

## 2018-12-16 DIAGNOSIS — M5442 Lumbago with sciatica, left side: Secondary | ICD-10-CM | POA: Diagnosis not present

## 2018-12-16 DIAGNOSIS — M6281 Muscle weakness (generalized): Secondary | ICD-10-CM | POA: Diagnosis not present

## 2018-12-16 DIAGNOSIS — M62838 Other muscle spasm: Secondary | ICD-10-CM

## 2018-12-16 DIAGNOSIS — M542 Cervicalgia: Secondary | ICD-10-CM | POA: Diagnosis not present

## 2018-12-16 DIAGNOSIS — M25611 Stiffness of right shoulder, not elsewhere classified: Secondary | ICD-10-CM | POA: Diagnosis not present

## 2018-12-16 NOTE — Patient Instructions (Addendum)
To add to fly fishing breaks   Seated, hands behind hips, pressing down, shoulders down, slight shoulder turn and head turn to the L    10 reps -15 reps   ___   UE machine wourkout:   Chest press/pull : hold bar with thumbs out, fingers up to lengthen pects    Maintain shoulders engagement down and back on the release phase of the weights (low weights)    Minimize upper trap use     Add on cable column lowest weight  B sides "Pulling seat belt"  Stand 45 deg to cable column  Exhale to pull band from R ear height across body to the L pocket without turning pelvis and knees  Follow hand with eyes/head  10 x 2 reps

## 2018-12-16 NOTE — Therapy (Addendum)
[place] MAIN [facility] SERVICES [address] [place], [place], [address] Phone: [phone]   Fax:  [phone]  Physical Therapy Treatment  Patient Details  Name: Randall HARBAUGH, Randall Shannon MRN: [id number] Date of Birth: 05-Sep-1956 Referring Provider (PT): Dr. Doy Hutching    Encounter Date: 12/16/2018  PT End of Session - 12/16/18 1013    Visit Number  5    PT Start Time  1010    PT Stop Time  1110    PT Time Calculation (min)  60 min    Activity Tolerance  Patient tolerated treatment well    Behavior During Therapy  [facility] for tasks assessed/performed       Past Medical History:  Diagnosis Date  . Atypical atrial flutter ([place])   . Cervical spondylosis with myelopathy and radiculopathy   . Enlarged prostate   . Restless leg syndrome   . Sleep-disordered breathing     Past Surgical History:  Procedure Laterality Date  . ANTERIOR CERVICAL DECOMP/DISCECTOMY FUSION N/A 05/13/2018   Procedure: ANTERIOR CERVICAL DECOMPRESSION/DISCECTOMY FUSION , INTERBODY PROSTHESIS, PLATE/SCREWS CERVICAL FOUR- CERVICAL FIVE, CERVICAL FIVE- CERVICAL SIX, CERVICAL SIX- CERVICAL SEVEN;  Surgeon: Newman Pies, Randall Shannon;  Location: [place];  Service: Neurosurgery;  Laterality: N/A;  . APPENDECTOMY    . BACK SURGERY  2009  . ELECTROPHYSIOLOGIC STUDY N/A 04/07/2016   Procedure: A-Flutter Ablation;  Surgeon: Deboraha Sprang, Randall Shannon;  Location: [place] [facility];  Service: Cardiovascular;  Laterality: N/A;  . HERNIA REPAIR Bilateral    2 left side, 1 on right inguinal  . LUMBAR LAMINECTOMY/DECOMPRESSION MICRODISCECTOMY N/A 02/04/2014   Procedure: Lumbar Three-Four Laminectomy, Lumbar Two Laminotomy;  Surgeon: Ophelia Charter, Randall Shannon;  Location: [place] [facility];  Service: Neurosurgery;  Laterality: N/A;  Lumbar Three-Four Laminectomy, Lumbar Two Laminotomy  . TONSILLECTOMY      There were no vitals filed for this visit.  Subjective Assessment - 12/16/18 1012    Subjective  Pt reports the L  shoulder and neck area is better with rolling over. He still notices a quick spasm over the pectoralis.     Pertinent History  Hx abdominal surgeries: appendix removal, back surgery L4-5 discetomy 2009 and laminectomy 2016, July 2019 cervical fusion.  Denied changes in B & B.  Denied neck / back pain with sleeping.           [facility] PT Assessment - 12/16/18 1014      Observation/Other Assessments   Observations  reaching back: thumb T12 on R hand reach back, L T10 .  reaching behind nack: T2 B       Other:   Other/ Comments  noted protraction on eccentric phase of chest row/pull machine , light weights with shakiness at scap B       AROM   Overall AROM Comments  Cervical R sideflexion 40 deg, L 30 deg.  Flexion/ Ext 45 deg, Rotation 55 deg B   ( Post Tx: 45 deg L sideflexion)          Strength   Overall Strength Comments  hand grip strength ( 20 kg average R, 40 avg L)       Palpation   Spinal mobility  C4-T1 segments with slight L posterior rotation, interspinals tightness ( decreased post Tx)     SI assessment                       [facility] Adult PT Treatment/Exercise - 12/16/18 1213  Neuro Re-ed    Neuro Re-ed Details   cued for maintaining scap retraction/ depression with chest press/pull machine, cued and explained hand position for shoulder ER insteadof IR which shortens pects, tactile and verbal cues to not turn pelvic and knees with PNF exercise at cable column        Manual Therapy   Manual therapy comments  PA mob III at T3-7, MWM at T1-4, inferior mob at scap spine with STM release at posterior scalenes/levator scap L                   PT Long Term Goals - 12/16/18 1803      PT LONG TERM GOAL #1   Title  Pt will demo wrist ulnar/ radial deviation with supination/ elbow flexion ( simualted fly fishing task) increased strength 3/5 R, L 5/5  in order to fly fish     Time  4    Period  Weeks    Status  On going    Target Date  12/16/18      PT  LONG TERM GOAL #2   Title  Pt will demo increased cervical flex from 25 deg to > 35 deg, L sideflexion from 15 deg to > 30 deg in order to drive  safely     Baseline  cervical ext35deg,25 deg flex, R sideflexion 35 deg, L 15 deg, cervical rotation 35 deg B      Time  8    Period  Weeks    Status   On going      Target Date  01/13/19      PT LONG TERM GOAL #3   Title  hand grip strength to increase from avg of 18.6 kg to > 30 kg in order to grab cups and folders     Baseline  avg of 3 trials R 18.6 kg, L 41.33 L     Time  10    Period  Weeks    Status  On going      Target Date  01/27/19      PT LONG TERM GOAL #4   Title  Pt will demo increased mobility with reaching behind back with RUE to level of T10 in order to rach for back pocket      Baseline  R at L1, L at T10    Time  6    Period  Weeks    Status  On going      Target Date  01/01/19      PT LONG TERM GOAL #5   Title  Pt will demo no lumbopelvic perturbation with L shoulder scaption/ R hip ext in MMT in order to demo stronger deep core/ thoracolumbar strength and to progress to weight training for BLE tih less risk for sciatic Sx    Time  10    Period  Weeks    Status    On going      Target Date  01/29/19      Additional Long Term Goals   Additional Long Term Goals  Yes      PT LONG TERM GOAL #6   Title  Pt will demo IND with proper alignment and technique with co-activation of thoracolumbar/ lower kinetic chain, deep core mm with weight machines and body weight stengthening in order to minimize risk for injuries     Time  10    Period  Weeks    Status    On going  Target Date  01/29/19      PT LONG TERM GOAL #7   Title  Pt will demo IND with flexibility routine / modifications to yoga in order to minimize risk for injuries     Time  10    Period  Weeks    Status    On going      Target Date  01/29/19      PT LONG TERM GOAL #8   Title  Pt will decrease his score on PSQI from % to  <%  in order to  improve sleep quality     Time  5    Period  Weeks    Status  New    Target Date  12/25/18      PT LONG TERM GOAL  #9   TITLE  Pt will increase his PFSF score ( fly fishing from 7 to >9/10, resistance exercise  from 6 to > 8 /10, fly rod building from 8 to 10/10)     Time  10    Period  Weeks    Status    On going      Target Date  01/29/19            Plan - 12/16/18 1219    Clinical Impression Statement  Achieved increased L sideflexion from 35 deg to 45 deg after manual Tx to increase L rotation at C/T junction. Applied low grade mobs below cervical fusion site and STM/MWM to decrease mm tightness. Pt reported no pain with Tx and stated he felt less cramping at sternum/ pects with rolling on L side after Tx.  Reassessed hand grip strength which has made minimal change. Plan to continue modifying pt's weight lifting routine to minimize shoulder IR and to assess brachial plexus pliability to improve hand grip strength.  All other myotome to R UE has increased in strength.  Initated PNF pattern in HEP for more pectoralis mm lengthening.   Pt continues to benefit from skilled PT.      Rehab Potential  Good    PT Frequency  1x / week    PT Duration  Other (comment)   10   PT Treatment/Interventions  ADLs/Self Care Home Management;Electrical Stimulation;Aquatic Therapy;Moist Heat;Stair training;Therapeutic activities;Therapeutic exercise;Orthotic Fit/Training;Patient/family education;Manual techniques;Scar mobilization;Energy conservation;Taping;Balance training;Neuromuscular re-education    Consulted and Agree with Plan of Care  Patient       Patient will benefit from skilled therapeutic intervention in order to improve the following deficits and impairments:  Decreased endurance, Increased muscle spasms, Postural dysfunction, Increased fascial restricitons, Decreased strength, Decreased coordination, Decreased scar mobility, Hypomobility, Improper body mechanics, Pain, Decreased range  of motion, Decreased mobility, Decreased safety awareness  Visit Diagnosis: Muscle weakness (generalized)  Stiffness of right shoulder, not elsewhere classified  Other muscle spasm  Chronic bilateral low back pain with bilateral sciatica     Problem List Patient Active Problem List   Diagnosis Date Noted  . Cervical spondylosis with myelopathy and radiculopathy 05/13/2018  . Fever and chills 11/16/2017  . Atypical atrial flutter ([place]) 04/07/2016  . Paroxysmal atrial flutter ([place]) 03/27/2016  . BPH with obstruction/lower urinary tract symptoms 07/14/2015  . Disc disease, degenerative, lumbar or lumbosacral 07/14/2015  . RLS (restless legs syndrome) 07/14/2015  . Lumbar stenosis with neurogenic claudication 02/04/2014  . Spinal stenosis of lumbar region 02/04/2014    Jerl Mina ,PT, DPT, E-RYT  12/16/2018, 12:24 PM  [place] MAIN [facility] SERVICES [place]  [place], [place], [address] Phone: [phone]   Fax:  [phone]  Name: Randall Serene, Randall Shannon MRN: [id number] Date of Birth: 1956/08/15

## 2018-12-23 ENCOUNTER — Ambulatory Visit: Payer: 59 | Attending: Internal Medicine | Admitting: Physical Therapy

## 2018-12-23 DIAGNOSIS — M62838 Other muscle spasm: Secondary | ICD-10-CM | POA: Insufficient documentation

## 2018-12-23 DIAGNOSIS — M25611 Stiffness of right shoulder, not elsewhere classified: Secondary | ICD-10-CM | POA: Diagnosis not present

## 2018-12-23 DIAGNOSIS — M6281 Muscle weakness (generalized): Secondary | ICD-10-CM | POA: Insufficient documentation

## 2018-12-23 DIAGNOSIS — M5441 Lumbago with sciatica, right side: Secondary | ICD-10-CM | POA: Diagnosis not present

## 2018-12-23 DIAGNOSIS — M5442 Lumbago with sciatica, left side: Secondary | ICD-10-CM | POA: Insufficient documentation

## 2018-12-23 DIAGNOSIS — G8929 Other chronic pain: Secondary | ICD-10-CM | POA: Insufficient documentation

## 2018-12-23 NOTE — Patient Instructions (Addendum)
  During [employer] breaks Server movement ( dont spill the plate)  Both arms  Wrist ext with shoulder back for pect stretch     In the morning and after workouts:  add in ulnar nerve A. ( elbow up and wrist ext stretch on bed)      B. (elbow to the side, with "Donnie move" with thumb/index finger")         C. Wrist and elbow ext against backs of chairs     Add cable column:  Standing PNF band exercises    "Drawing a sword" " Pulling a lawnmover" Band is under fee, hold band in L hand, across body by R pocket  Press downward into the ballmounds and heels of the feet, thigh muscle active, Keep knees and hips squared the front and do not move them throughout the activity, Exhale to pull band from R pocket,  across the face to the R pocket, rotating only your ribcage to L as if "drawing a sword"  FOCUS ON PUSHING INTO LEGS, Rotate only at the ribs   10 L 8 reps on R  7 lbs ___   Pulling seatbelt  2.5 lb  MAKE SURE MORE STABILITY IN LEGS, knee above ankle in front leg  Following hand with head movements

## 2018-12-23 NOTE — Therapy (Signed)
[place] MAIN [facility] SERVICES [address] [place], [place], [address] Phone: [phone]   Fax:  [phone]  Physical Therapy Treatment  Patient Details  Name: Randall BURMESTER, MD MRN: [id number] Date of Birth: 02/01/1956 Referring Provider (PT): Dr. Doy Hutching    Encounter Date: 12/23/2018  PT End of Session - 12/23/18 1550    Visit Number  6    PT Start Time  0810    PT Stop Time  0930    PT Time Calculation (min)  80 min    Activity Tolerance  Patient tolerated treatment well    Behavior During Therapy  [facility] for tasks assessed/performed       Past Medical History:  Diagnosis Date  . Atypical atrial flutter ([place])   . Cervical spondylosis with myelopathy and radiculopathy   . Enlarged prostate   . Restless leg syndrome   . Sleep-disordered breathing     Past Surgical History:  Procedure Laterality Date  . ANTERIOR CERVICAL DECOMP/DISCECTOMY FUSION N/A 05/13/2018   Procedure: ANTERIOR CERVICAL DECOMPRESSION/DISCECTOMY FUSION , INTERBODY PROSTHESIS, PLATE/SCREWS CERVICAL FOUR- CERVICAL FIVE, CERVICAL FIVE- CERVICAL SIX, CERVICAL SIX- CERVICAL SEVEN;  Surgeon: Newman Pies, MD;  Location: [place];  Service: Neurosurgery;  Laterality: N/A;  . APPENDECTOMY    . BACK SURGERY  2009  . ELECTROPHYSIOLOGIC STUDY N/A 04/07/2016   Procedure: A-Flutter Ablation;  Surgeon: Deboraha Sprang, MD;  Location: [place] CV LAB;  Service: Cardiovascular;  Laterality: N/A;  . HERNIA REPAIR Bilateral    2 left side, 1 on right inguinal  . LUMBAR LAMINECTOMY/DECOMPRESSION MICRODISCECTOMY N/A 02/04/2014   Procedure: Lumbar Three-Four Laminectomy, Lumbar Two Laminotomy;  Surgeon: Ophelia Charter, MD;  Location: [place] [facility];  Service: Neurosurgery;  Laterality: N/A;  Lumbar Three-Four Laminectomy, Lumbar Two Laminotomy  . TONSILLECTOMY      There were no vitals filed for this visit.  Subjective Assessment - 12/23/18 1556    Subjective  Pt reports he went  fly fishing over the weekend and practiced his stretches.     Pertinent History  Hx abdominal surgeries: appendix removal, back surgery L4-5 discetomy 2009 and laminectomy 2016, July 2019 cervical fusion.  Denied changes in B & B.  Denied neck / back pain with sleeping.           [facility] PT Assessment - 12/23/18 1537      Observation/Other Assessments   Observations  reach behind back T10 L thumb, T12 is the R thumb. T1-T2 hypomobile        AROM   Overall AROM Comments  sideflexion B 35 deg, ext/ flex 45 deg , rotation 55 deg B.  limited metacarpal / wrist ext          Strength   Overall Strength Comments  supination/pronation B 5/5, wrist flex/ext R 4-/5, L 5/5, ulnar devaition 4-/5, L 5/5 , radial deviation 3+/5        Palpation   Palpation comment  increased tightness at flexor carpi ulnaris and radalis,                    [facility] Adult PT Treatment/Exercise - 12/23/18 1544      Neuro Re-ed    Neuro Re-ed Details   cued for nerve glides for R UE, cued for lower extremity stability in PNF exercises at cable column with loweer weights, exercise to gain propioception training for more lower kinetic chain activation for balance/ and pushing  Manual Therapy   Manual therapy comments  inferior mob at 1st rib, AP mob at [facility] joint, STM at flexor carpi radialis/ulnaris R, STM to faciliate wrist /metacarpal ext / abduction,                    PT Long Term Goals - 11/20/18 1803      PT LONG TERM GOAL #1   Title  Pt will demo wrist ulnar/ radial deviation with supination/ elbow flexion ( simualted fly fishing task) increased strength 3/5 R, L 5/5  in order to fly fish     Time  4    Period  Weeks    Status  New    Target Date  12/16/18      PT LONG TERM GOAL #2   Title  Pt will demo increased cervical flex from 25 deg to > 35 deg, L sideflexion from 15 deg to > 30 deg in order to drive  safely     Baseline  cervical ext35deg,25 deg flex, R sideflexion 35 deg, L  15 deg, cervical rotation 35 deg B      Time  8    Period  Weeks    Status  New    Target Date  01/13/19      PT LONG TERM GOAL #3   Title  hand grip strength to increase from avg of 18.6 kg to > 30 kg in order to grab cups and folders     Baseline  avg of 3 trials R 18.6 kg, L 41.33 L     Time  10    Period  Weeks    Status  New    Target Date  01/27/19      PT LONG TERM GOAL #4   Title  Pt will demo increased mobility with reaching behind back with RUE to level of T10 in order to rach for back pocket      Baseline  R at L1, L at T10    Time  6    Period  Weeks    Status  New    Target Date  01/01/19      PT LONG TERM GOAL #5   Title  Pt will demo no lumbopelvic perturbation with L shoulder scaption/ R hip ext in MMT in order to demo stronger deep core/ thoracolumbar strength and to progress to weight training for BLE tih less risk for sciatic Sx    Time  10    Period  Weeks    Status  New    Target Date  01/29/19      Additional Long Term Goals   Additional Long Term Goals  Yes      PT LONG TERM GOAL #6   Title  Pt will demo IND with proper alignment and technique with co-activation of thoracolumbar/ lower kinetic chain, deep core mm with weight machines and body weight stengthening in order to minimize risk for injuries     Time  10    Period  Weeks    Status  New    Target Date  01/29/19      PT LONG TERM GOAL #7   Title  Pt will demo IND with flexibility routine / modifications to yoga in order to minimize risk for injuries     Time  10    Period  Weeks    Status  New    Target Date  01/29/19      PT  LONG TERM GOAL #8   Title  Pt will decrease his score on PSQI from % to  <%  in order to improve sleep quality     Time  5    Period  Weeks    Status  New    Target Date  12/25/18      PT LONG TERM GOAL  #9   TITLE  Pt will increase his PFSF score ( fly fishing from 7 to >9/10, resistance exercise  from 6 to > 8 /10, fly rod building from 8 to 10/10)     Time   10    Period  Weeks    Status  New    Target Date  01/29/19            Plan - 12/23/18 1553    Clinical Impression Statement  Pt continues to have maintain increased rotation, ext, flexion but sideflexion remains unchanged. Focused on increase wrist flexion/ ext on R with manual Tx which helped to improve neural glides and decreasing wrist flexor mm and increasing wrist/ metacarpal ext. Pt required propioception training to utilize more lower kinetic chain in upper trunk rotation exercises and to apply in fly fishing when stabilizing in the water. Pt demo'd improved stability with better knee alignment of front leg post training. Pt continues to benefit from skilled PT.     Rehab Potential  Good    PT Frequency  1x / week    PT Duration  Other (comment)   10   PT Treatment/Interventions  ADLs/Self Care Home Management;Electrical Stimulation;Aquatic Therapy;Moist Heat;Stair training;Therapeutic activities;Therapeutic exercise;Orthotic Fit/Training;Patient/family education;Manual techniques;Scar mobilization;Energy conservation;Taping;Balance training;Neuromuscular re-education    Consulted and Agree with Plan of Care  Patient       Patient will benefit from skilled therapeutic intervention in order to improve the following deficits and impairments:  Decreased endurance, Increased muscle spasms, Postural dysfunction, Increased fascial restricitons, Decreased strength, Decreased coordination, Decreased scar mobility, Hypomobility, Improper body mechanics, Pain, Decreased range of motion, Decreased mobility, Decreased safety awareness  Visit Diagnosis: Muscle weakness (generalized)  Stiffness of right shoulder, not elsewhere classified  Chronic bilateral low back pain with bilateral sciatica  Other muscle spasm     Problem List Patient Active Problem List   Diagnosis Date Noted  . Cervical spondylosis with myelopathy and radiculopathy 05/13/2018  . Fever and chills 11/16/2017  .  Atypical atrial flutter ([place]) 04/07/2016  . Paroxysmal atrial flutter ([place]) 03/27/2016  . BPH with obstruction/lower urinary tract symptoms 07/14/2015  . Disc disease, degenerative, lumbar or lumbosacral 07/14/2015  . RLS (restless legs syndrome) 07/14/2015  . Lumbar stenosis with neurogenic claudication 02/04/2014  . Spinal stenosis of lumbar region 02/04/2014    Jerl Mina ,PT, DPT, E-RYT  12/23/2018, 3:57 PM  [place] MAIN [facility] SERVICES [address] [place], [place], [address] Phone: [phone]   Fax:  [phone]  Name: JAZ MALLICK, MD MRN: [id number] Date of Birth: 23-May-1956

## 2018-12-30 ENCOUNTER — Ambulatory Visit: Payer: 59 | Admitting: Physical Therapy

## 2018-12-30 DIAGNOSIS — M5442 Lumbago with sciatica, left side: Secondary | ICD-10-CM | POA: Diagnosis not present

## 2018-12-30 DIAGNOSIS — M6281 Muscle weakness (generalized): Secondary | ICD-10-CM | POA: Diagnosis not present

## 2018-12-30 DIAGNOSIS — M5441 Lumbago with sciatica, right side: Secondary | ICD-10-CM | POA: Diagnosis not present

## 2018-12-30 DIAGNOSIS — G8929 Other chronic pain: Secondary | ICD-10-CM | POA: Diagnosis not present

## 2018-12-30 DIAGNOSIS — M62838 Other muscle spasm: Secondary | ICD-10-CM

## 2018-12-30 DIAGNOSIS — M25611 Stiffness of right shoulder, not elsewhere classified: Secondary | ICD-10-CM | POA: Diagnosis not present

## 2018-12-31 NOTE — Therapy (Signed)
[place] MAIN [facility] SERVICES [address] [place], [place], [address] Phone: [phone]   Fax:  [phone]  Physical Therapy Treatment  Patient Details  Name: Randall DEREN, Randall Shannon MRN: [id number] Date of Birth: 02-25-1956 Referring Provider (PT): Dr. Doy Hutching    Encounter Date: 12/30/2018  PT End of Session - 12/31/18 1423    Visit Number  7    PT Start Time  0815    PT Stop Time  0920    PT Time Calculation (min)  65 min    Activity Tolerance  Patient tolerated treatment well    Behavior During Therapy  [facility] for tasks assessed/performed       Past Medical History:  Diagnosis Date  . Atypical atrial flutter ([place])   . Cervical spondylosis with myelopathy and radiculopathy   . Enlarged prostate   . Restless leg syndrome   . Sleep-disordered breathing     Past Surgical History:  Procedure Laterality Date  . ANTERIOR CERVICAL DECOMP/DISCECTOMY FUSION N/A 05/13/2018   Procedure: ANTERIOR CERVICAL DECOMPRESSION/DISCECTOMY FUSION , INTERBODY PROSTHESIS, PLATE/SCREWS CERVICAL FOUR- CERVICAL FIVE, CERVICAL FIVE- CERVICAL SIX, CERVICAL SIX- CERVICAL SEVEN;  Surgeon: Newman Pies, Randall Shannon;  Location: [place];  Service: Neurosurgery;  Laterality: N/A;  . APPENDECTOMY    . BACK SURGERY  2009  . ELECTROPHYSIOLOGIC STUDY N/A 04/07/2016   Procedure: A-Flutter Ablation;  Surgeon: Deboraha Sprang, Randall Shannon;  Location: [place] [facility];  Service: Cardiovascular;  Laterality: N/A;  . HERNIA REPAIR Bilateral    2 left side, 1 on right inguinal  . LUMBAR LAMINECTOMY/DECOMPRESSION MICRODISCECTOMY N/A 02/04/2014   Procedure: Lumbar Three-Four Laminectomy, Lumbar Two Laminotomy;  Surgeon: Ophelia Charter, Randall Shannon;  Location: [place] [facility];  Service: Neurosurgery;  Laterality: N/A;  Lumbar Three-Four Laminectomy, Lumbar Two Laminotomy  . TONSILLECTOMY      There were no vitals filed for this visit.  Subjective Assessment - 12/31/18 0813    Subjective  Pt reported he went  fly fishing on a boat over the weekend. Pt felt less cramping in his arm while fly fishing but he notices this wrist still feels weak when he pulls his fishing rod out of the water    Pertinent History  Hx abdominal surgeries: appendix removal, back surgery L4-5 discetomy 2009 and laminectomy 2016, July 2019 cervical fusion.  Denied changes in B & B.  Denied neck / back pain with sleeping.           [facility] PT Assessment - 12/31/18 0821      AROM   Overall AROM Comments  increased ext of metatarsal, wrist, shoulder abd  ( ulnar nn glide position)        Palpation   Palpation comment  decreased tightness at flexor compartment at forearm compared to last session. Noted today increased tightness flexor pollicis brevis, abductor brevis pollicis, first dorsal interroseus, extensor pollicis longus                      [facility] Adult PT Treatment/Exercise - 12/31/18 1055      Therapeutic Activites    Therapeutic Activities  --    Other Therapeutic Activities  simulated luggage lifting w/ box, fly fishing in boat to provide body mechanics       Neuro Re-ed    Neuro Re-ed Details   cued for more co-activation of lower kinetic chain/ closer COM , proper lifting and turning body with putting luggage in overhead bin ( simulated task)  Exercises   Other Exercises   see pt instructions,       Manual Therapy   Manual therapy comments  PA/AP glides at ulnar/radius, scaphoid/ lunate/ triquetrium and STM / MWM at mm noted in assessment                   PT Long Term Goals - 11/20/18 1803      PT LONG TERM GOAL #1   Title  Pt will demo wrist ulnar/ radial deviation with supination/ elbow flexion ( simualted fly fishing task) increased strength 3/5 R, L 5/5  in order to fly fish     Time  4    Period  Weeks    Status  New    Target Date  12/16/18      PT LONG TERM GOAL #2   Title  Pt will demo increased cervical flex from 25 deg to > 35 deg, L sideflexion from 15 deg to >  30 deg in order to drive  safely     Baseline  cervical ext35deg,25 deg flex, R sideflexion 35 deg, L 15 deg, cervical rotation 35 deg B      Time  8    Period  Weeks    Status  New    Target Date  01/13/19      PT LONG TERM GOAL #3   Title  hand grip strength to increase from avg of 18.6 kg to > 30 kg in order to grab cups and folders     Baseline  avg of 3 trials R 18.6 kg, L 41.33 L     Time  10    Period  Weeks    Status  New    Target Date  01/27/19      PT LONG TERM GOAL #4   Title  Pt will demo increased mobility with reaching behind back with RUE to level of T10 in order to rach for back pocket      Baseline  R at L1, L at T10    Time  6    Period  Weeks    Status  New    Target Date  01/01/19      PT LONG TERM GOAL #5   Title  Pt will demo no lumbopelvic perturbation with L shoulder scaption/ R hip ext in MMT in order to demo stronger deep core/ thoracolumbar strength and to progress to weight training for BLE tih less risk for sciatic Sx    Time  10    Period  Weeks    Status  New    Target Date  01/29/19      Additional Long Term Goals   Additional Long Term Goals  Yes      PT LONG TERM GOAL #6   Title  Pt will demo IND with proper alignment and technique with co-activation of thoracolumbar/ lower kinetic chain, deep core mm with weight machines and body weight stengthening in order to minimize risk for injuries     Time  10    Period  Weeks    Status  New    Target Date  01/29/19      PT LONG TERM GOAL #7   Title  Pt will demo IND with flexibility routine / modifications to yoga in order to minimize risk for injuries     Time  10    Period  Weeks    Status  New    Target Date  01/29/19      PT LONG TERM GOAL #8   Title  Pt will decrease his score on PSQI from % to  <%  in order to improve sleep quality     Time  5    Period  Weeks    Status  New    Target Date  12/25/18      PT LONG TERM GOAL  #9   TITLE  Pt will increase his PFSF score ( fly fishing  from 7 to >9/10, resistance exercise  from 6 to > 8 /10, fly rod building from 8 to 10/10)     Time  10    Period  Weeks    Status  New    Target Date  01/29/19            Plan - 12/31/18 1455    Clinical Impression Statement  Pt showed increased upper quadrant mobility on RUE, more extension at C/T junction, and decreased forearm mm tightness. Focused today on decreasing mm tightness at 1st metacarpal joint to improve thumb extension and abduction and progressed to isometric strengthening. Applied more propioception training to utilize more lower kinetic chain/ scapular co-activation with fly fishing and body mecahnics training with simulated task to lift luggage at airport and overhead bins to minimize injuries. Pt demo'd correctly post training. Pt continues to benefit from skilled PT.      Rehab Potential  Good    PT Frequency  1x / week    PT Duration  Other (comment)   10   PT Treatment/Interventions  ADLs/Self Care Home Management;Electrical Stimulation;Aquatic Therapy;Moist Heat;Stair training;Therapeutic activities;Therapeutic exercise;Orthotic Fit/Training;Patient/family education;Manual techniques;Scar mobilization;Energy conservation;Taping;Balance training;Neuromuscular re-education    Consulted and Agree with Plan of Care  Patient       Patient will benefit from skilled therapeutic intervention in order to improve the following deficits and impairments:  Decreased endurance, Increased muscle spasms, Postural dysfunction, Increased fascial restricitons, Decreased strength, Decreased coordination, Decreased scar mobility, Hypomobility, Improper body mechanics, Pain, Decreased range of motion, Decreased mobility, Decreased safety awareness  Visit Diagnosis: Muscle weakness (generalized)  Stiffness of right shoulder, not elsewhere classified  Chronic bilateral low back pain with bilateral sciatica  Other muscle spasm     Problem List Patient Active Problem List    Diagnosis Date Noted  . Cervical spondylosis with myelopathy and radiculopathy 05/13/2018  . Fever and chills 11/16/2017  . Atypical atrial flutter ([place]) 04/07/2016  . Paroxysmal atrial flutter ([place]) 03/27/2016  . BPH with obstruction/lower urinary tract symptoms 07/14/2015  . Disc disease, degenerative, lumbar or lumbosacral 07/14/2015  . RLS (restless legs syndrome) 07/14/2015  . Lumbar stenosis with neurogenic claudication 02/04/2014  . Spinal stenosis of lumbar region 02/04/2014    Jerl Mina ,PT, DPT, E-RYT  12/31/2018, 3:00 PM  [place] MAIN [facility] SERVICES [address] [place], [place], [address] Phone: [phone]   Fax:  [phone]  Name: Randall LIMBACH, Randall Shannon MRN: [id number] Date of Birth: 02/22/1956

## 2018-12-31 NOTE — Patient Instructions (Signed)
WITH casting fishing rod: _more co-activation of lower kinetic chain ( legs) and elbow closer ribs ( closer to [place]) , shoulder blades down and back   WITH LUGGAGE lifting at [employer] bin  _proper lifting with [employer], use other hand to lift bottom of luggage and keep close to [employer]   turning body with feet first with putting luggage in overhead bin ( simulated task)  _ Pulling luggage out:  Use lunge, weight shift to back leg    ___  Stretches for thumb:  by wall:  thumb up like "OK" and "hike hiking, thumb back"   Isometric strengthening, use other hand to stabilize around snuff box    ___ Stretches for  Upper arm whole chain while lengthening neck : Grasping by occipit, elbows out, push head back while lifting skull up against interlaced palms

## 2019-01-06 ENCOUNTER — Ambulatory Visit: Payer: 59 | Admitting: Physical Therapy

## 2019-01-06 ENCOUNTER — Other Ambulatory Visit: Payer: Self-pay

## 2019-01-06 DIAGNOSIS — M6281 Muscle weakness (generalized): Secondary | ICD-10-CM

## 2019-01-06 DIAGNOSIS — M25611 Stiffness of right shoulder, not elsewhere classified: Secondary | ICD-10-CM

## 2019-01-06 DIAGNOSIS — M62838 Other muscle spasm: Secondary | ICD-10-CM | POA: Diagnosis not present

## 2019-01-06 DIAGNOSIS — M5441 Lumbago with sciatica, right side: Secondary | ICD-10-CM

## 2019-01-06 DIAGNOSIS — G8929 Other chronic pain: Secondary | ICD-10-CM

## 2019-01-06 DIAGNOSIS — M5442 Lumbago with sciatica, left side: Secondary | ICD-10-CM

## 2019-01-06 NOTE — Patient Instructions (Signed)
Dissociation between legs / lumbar and thorax with the bow and arrow exercise   _practice without yellow band first  10 reps  _ ski track stance, headlights of feet, hips, knees - GROUNDING   _ribs above ASIS to minimize lumbar lordosis  _arrow arm: less elbow moving back, focus on shoulders down and back, less upper trap muscle _bow arm ( thumb up like hiker's thumb) against band,    _practice with band 10 reps each side ( think small trunk turns   ___  Refine the deep core  _slower _pause _reset with suction cups visualization for stability: ( back of ribs/ PSIS, 4 point in the feet )

## 2019-01-07 NOTE — Therapy (Addendum)
[place] MAIN [facility] SERVICES [address] [place], [place], [address] Phone: [phone]   Fax:  [phone]  Physical Therapy Treatment  Patient Details  Name: Randall KOPF, Randall Shannon MRN: [id number] Date of Birth: 1956/04/05 Referring Provider (PT): Dr. Doy Hutching    Encounter Date: 01/06/2019  PT End of Session - 01/06/19 0807    Visit Number  8    PT Start Time  0800    PT Stop Time  0905    PT Time Calculation (min)  65 min    Activity Tolerance  Patient tolerated treatment well    Behavior During Therapy  [facility] for tasks assessed/performed       Past Medical History:  Diagnosis Date  . Atypical atrial flutter ([place])   . Cervical spondylosis with myelopathy and radiculopathy   . Enlarged prostate   . Restless leg syndrome   . Sleep-disordered breathing     Past Surgical History:  Procedure Laterality Date  . ANTERIOR CERVICAL DECOMP/DISCECTOMY FUSION N/A 05/13/2018   Procedure: ANTERIOR CERVICAL DECOMPRESSION/DISCECTOMY FUSION , INTERBODY PROSTHESIS, PLATE/SCREWS CERVICAL FOUR- CERVICAL FIVE, CERVICAL FIVE- CERVICAL SIX, CERVICAL SIX- CERVICAL SEVEN;  Surgeon: Newman Pies, Randall Shannon;  Location: [facility];  Service: Neurosurgery;  Laterality: N/A;  . APPENDECTOMY    . BACK SURGERY  2009  . ELECTROPHYSIOLOGIC STUDY N/A 04/07/2016   Procedure: A-Flutter Ablation;  Surgeon: Deboraha Sprang, Randall Shannon;  Location: [facility];  Service: Cardiovascular;  Laterality: N/A;  . HERNIA REPAIR Bilateral    2 left side, 1 on right inguinal  . LUMBAR LAMINECTOMY/DECOMPRESSION MICRODISCECTOMY N/A 02/04/2014   Procedure: Lumbar Three-Four Laminectomy, Lumbar Two Laminotomy;  Surgeon: Ophelia Charter, Randall Shannon;  Location: [place];  Service: Neurosurgery;  Laterality: N/A;  Lumbar Three-Four Laminectomy, Lumbar Two Laminotomy  . TONSILLECTOMY      There were no vitals filed for this visit.  Subjective Assessment - 01/06/19 0802    Subjective  Pt reported the one  day he did not stretch, pt went on a 5 mi hike with hills and the next morning , he woke up with L LBP but without radiating Sx ( 8/10) . Pt states he was lazy that day.  The pain required taking an Alleve and did stretches and used the heating pad. The pain got much better after 30 mins.  Today, the pain became minimal (1/10) .     Pertinent History  Hx abdominal surgeries: appendix removal, back surgery L4-5 discetomy 2009 and laminectomy 2016, July 2019 cervical fusion.  Denied changes in B & B.  Denied neck / back pain with sleeping.           [facility] PT Assessment - 01/07/19 0835      Observation/Other Assessments   Observations  poor dissassociation of trunk/pelvis-BLE , poor propioceptionof knee alignment/ hip alignment in lunge, increased lumbar lordosis       AROM   Overall AROM Comments  achieved shoulder scaption, elbow flexion, wrist ext, ( ulnar glide position)       Strength   Overall Strength Comments  dynameter, hand grip strength ( R 20 lb avg, across 3 trials, L 40 lb)       Palpation   Palpation comment  limited lateral excursion of diaphragm, intercostal mm tightness B, excessive abdominal straining with inhalation                    [facility] Adult PT Treatment/Exercise - 01/07/19 7106  Neuro Re-ed    Neuro Re-ed Details   excessive cues for scapular / thoracolumbar strengthening / dissoassociation of thorax/ pelvis/ BLE in new exercises, cued for proper technique in deep core strengthening  ( see pt instructions )        Manual Therapy   Manual therapy comments  intercostal anterior/ lateral/ posterior to faciliate increased lateral excursion of thorax/ diaphragm            PT LONG TERM GOAL #1    Title  Pt will demo wrist ulnar/ radial deviation with supination/ elbow flexion ( simualted fly fishing task) increased strength 3/5 R, L 5/5  in order to fly fish     Time  4    Period  Weeks    Status  Achieved   Target Date  12/16/18         PT LONG TERM GOAL #2   Title  Pt will demo increased cervical flex from 25 deg to > 35 deg, L sideflexion from 15 deg to > 30 deg in order to drive  safely     Baseline  cervical ext35deg,25 deg flex, R sideflexion 35 deg, L 15 deg, cervical rotation 35 deg B      Time  8    Period  Weeks    Status  Achieved   Target Date         PT LONG TERM GOAL #3   Title  hand grip strength to increase from avg of 18.6 kg to > 30 kg in order to grab cups and folders     Baseline  avg of 3 trials R 18.6 kg, L 41.33 L     Time  10    Period  Weeks    Status On going   Target Date  01/27/19        PT LONG TERM GOAL #4   Title  Pt will demo increased mobility with reaching behind back with RUE to level of T10 in order to rach for back pocket      Baseline  R  T10    Time  6    Period  Weeks    Status Achieved    Target Date  01/01/19        PT LONG TERM GOAL #5   Title  Pt will demo no lumbopelvic perturbation with L shoulder scaption/ R hip ext in MMT in order to demo stronger deep core/ thoracolumbar strength and to progress to weight training for BLE tih less risk for sciatic Sx    Time  10    Period  Weeks    Status  Achieved   Target Date  01/29/19        Additional Long Term Goals   Additional Long Term Goals  Yes        PT LONG TERM GOAL #6   Title  Pt will demo IND with proper alignment and technique with co-activation of thoracolumbar/ lower kinetic chain, deep core mm with weight machines and body weight stengthening in order to minimize risk for injuries     Time  10    Period  Weeks    Status Achieved   Target Date  01/29/19        PT LONG TERM GOAL #7   Title  Pt will demo IND with flexibility routine / modifications to yoga in order to minimize risk for injuries     Time  10    Period  Weeks  Status  Achieved   Target Date  01/29/19        PT LONG TERM GOAL #8   Title  Pt will decrease his score on PSQI from % to  <%  in  order to improve sleep quality     Time  5    Period  Weeks    Status  Deferred    Target Date  12/25/18        PT LONG TERM GOAL  #9   TITLE  Pt will increase his PFSF score ( fly fishing from 7 to >9/10, resistance exercise  from 6 to > 8 /10, fly rod building from 8 to 10/10)     Time  10    Period  Weeks    Status  ON GOING    Target Date  01/29/19                Plan - 01/07/19 0841    Clinical Impression Statement  Pt has acheived signficantly increased AROM in upper quarter of R UE in term extensions along kinetic chain.  Hand grip strength remained unchanged but added resistance band strengthening for thumb extensions and radial deviation position. Required a downgrade from blue band to yellow band due to weakness.  Pt required signficant cues to improve propioception at BLE /pelvis to dissassociate from thorax and cues for increased scapular stabilization and minimize upper trap overuse. Pt also requried manual Tx to increase lateral excursion of diaphragm and to minimize lumbopelvic pertubration with deep core strengthening exericse. Plan to progress with wrist/t thumb ext-abduction strengthening in addition to lumbar/ BLE strengthening to minimize LBP. Although pt reported LBP after 5 mi hike without stretching, pt did not have sciatica which pt has had a Hx of. He was able to manage his LBP with the set of stretches on his own when his LBP occurred and he voiced understanding the importance of performing his stretches daily. Pt continues to benefit from skilled PT.     Rehab Potential  Good    PT Frequency  1x / week    PT Duration  Other (comment)   10   PT Treatment/Interventions  ADLs/Self Care Home Management;Electrical Stimulation;Aquatic Therapy;Moist Heat;Stair training;Therapeutic activities;Therapeutic exercise;Orthotic Fit/Training;Patient/family education;Manual techniques;Scar mobilization;Energy conservation;Taping;Balance training;Neuromuscular  re-education    Consulted and Agree with Plan of Care  Patient       Patient will benefit from skilled therapeutic intervention in order to improve the following deficits and impairments:  Decreased endurance, Increased muscle spasms, Postural dysfunction, Increased fascial restricitons, Decreased strength, Decreased coordination, Decreased scar mobility, Hypomobility, Improper body mechanics, Pain, Decreased range of motion, Decreased mobility, Decreased safety awareness  Visit Diagnosis: Muscle weakness (generalized)  Stiffness of right shoulder, not elsewhere classified  Chronic bilateral low back pain with bilateral sciatica  Other muscle spasm     Problem List Patient Active Problem List   Diagnosis Date Noted  . Cervical spondylosis with myelopathy and radiculopathy 05/13/2018  . Fever and chills 11/16/2017  . Atypical atrial flutter ([place]) 04/07/2016  . Paroxysmal atrial flutter ([place]) 03/27/2016  . BPH with obstruction/lower urinary tract symptoms 07/14/2015  . Disc disease, degenerative, lumbar or lumbosacral 07/14/2015  . RLS (restless legs syndrome) 07/14/2015  . Lumbar stenosis with neurogenic claudication 02/04/2014  . Spinal stenosis of lumbar region 02/04/2014    Jerl Mina ,PT, DPT, E-RYT  01/07/2019, 8:53 AM  [place] MAIN [facility] SERVICES [place], [place], [address] Phone:  [phone]   Fax:  [phone]  Name: Randall Serene, Randall Shannon MRN: [id number] Date of Birth: 1956/04/02

## 2019-01-13 ENCOUNTER — Ambulatory Visit: Payer: 59 | Admitting: Physical Therapy

## 2019-01-27 ENCOUNTER — Encounter: Payer: 59 | Admitting: Physical Therapy

## 2019-02-10 ENCOUNTER — Encounter: Payer: 59 | Admitting: Physical Therapy

## 2019-02-24 ENCOUNTER — Encounter: Payer: 59 | Admitting: Physical Therapy

## 2019-03-10 ENCOUNTER — Encounter: Payer: 59 | Admitting: Physical Therapy

## 2019-03-10 ENCOUNTER — Encounter: Payer: Self-pay | Admitting: Physical Therapy

## 2019-03-10 NOTE — Therapy (Signed)
[place] MAIN [facility] SERVICES [address] [place], [place], [address] Phone: [phone]   Fax:  [phone]  Patient Details  Name: Randall SERVISS, MD MRN: [id number] Date of Birth: 10/28/1955 Referring Provider:  Dr. Doy Hutching   Encounter Date: 03/10/2019   Discharge Summary   Pt has achieved 6/9 goals. Pt has acheived signficantly increased AROM in upper quarter of R UE in term extensions along kinetic chain.  Pt has returned to fly fishing with significantly less pain. Hand grip strength remains unchanged but pt advised with resistance strengthening exercises.  RUE strength has improved, cervical/ thoracic spine ROM have also improved. Pt demo'd IND with proper alignment and technique with weight lifting to minimize overuse of upper trap mm. Addressed pt's LBP with improved mm mobility , body mechanics with lifting, and increased proprioception with diasociation of trunk and lower kinetic chain. Pt is ready for d/c.    PT Long Term Goals -       PT LONG TERM GOAL #1   Title  Pt will demo wrist ulnar/ radial deviation with supination/ elbow flexion ( simualted fly fishing task) increased strength 3/5 R, L 5/5  in order to fly fish     Time  4    Period  Weeks    Status  Achieved   Target Date  12/16/18      PT LONG TERM GOAL #2   Title  Pt will demo increased cervical flex from 25 deg to > 35 deg, L sideflexion from 15 deg to > 30 deg in order to drive  safely     Baseline  cervical ext35deg,25 deg flex, R sideflexion 35 deg, L 15 deg, cervical rotation 35 deg B      Time  8    Period  Weeks    Status  Achieved   Target Date       PT LONG TERM GOAL #3   Title  hand grip strength to increase from avg of 18.6 kg to > 30 kg in order to grab cups and folders     Baseline  avg of 3 trials R 18.6 kg, L 41.33 L     Time  10    Period  Weeks    Status  not met   Target Date  01/27/19      PT LONG TERM GOAL #4   Title  Pt will demo increased  mobility with reaching behind back with RUE to level of T10 in order to rach for back pocket      Baseline  R  T10    Time  6    Period  Weeks    Status Achieved    Target Date  01/01/19      PT LONG TERM GOAL #5   Title  Pt will demo no lumbopelvic perturbation with L shoulder scaption/ R hip ext in MMT in order to demo stronger deep core/ thoracolumbar strength and to progress to weight training for BLE tih less risk for sciatic Sx    Time  10    Period  Weeks    Status  Achieved   Target Date  01/29/19      Additional Long Term Goals   Additional Long Term Goals  Yes      PT LONG TERM GOAL #6   Title  Pt will demo IND with proper alignment and technique with co-activation of thoracolumbar/ lower kinetic chain, deep core mm with weight machines  and body weight stengthening in order to minimize risk for injuries     Time  10    Period  Weeks    Status Achieved   Target Date  01/29/19      PT LONG TERM GOAL #7   Title  Pt will demo IND with flexibility routine / modifications to yoga in order to minimize risk for injuries     Time  10    Period  Weeks    Status  Achieved   Target Date  01/29/19      PT LONG TERM GOAL #8   Title  Pt will decrease his score on PSQI from % to  <%  in order to improve sleep quality     Time  5    Period  Weeks    Status  Deferred    Target Date  12/25/18      PT LONG TERM GOAL  #9   TITLE  Pt will increase his PFSF score ( fly fishing from 7 to >9/10, resistance exercise  from 6 to > 8 /10, fly rod building from 8 to 10/10)     Time  10    Period  Weeks    Status  Unable to reassess    Target Date  01/29/19        Jerl Mina ,PT, DPT, E-RYT  03/10/2019, 4:00 PM  [place] [address] [place], [place], [address] Phone: [phone]   Fax:  [phone]

## 2019-03-25 DIAGNOSIS — R030 Elevated blood-pressure reading, without diagnosis of hypertension: Secondary | ICD-10-CM | POA: Diagnosis not present

## 2019-03-25 DIAGNOSIS — M4712 Other spondylosis with myelopathy, cervical region: Secondary | ICD-10-CM | POA: Diagnosis not present

## 2019-03-25 DIAGNOSIS — Z6827 Body mass index (BMI) 27.0-27.9, adult: Secondary | ICD-10-CM | POA: Diagnosis not present

## 2019-05-14 ENCOUNTER — Other Ambulatory Visit: Payer: Self-pay | Admitting: Internal Medicine

## 2019-05-14 DIAGNOSIS — R6889 Other general symptoms and signs: Secondary | ICD-10-CM | POA: Diagnosis not present

## 2019-05-14 DIAGNOSIS — Z20822 Contact with and (suspected) exposure to covid-19: Secondary | ICD-10-CM

## 2019-05-18 LAB — NOVEL CORONAVIRUS, NAA: SARS-CoV-2, NAA: NOT DETECTED

## 2019-06-15 IMAGING — MR MR CERVICAL SPINE W/O CM
5 series · 33 of 48 positions shown · non-contrast
Comparison: None.

CLINICAL DATA: Neck stiffness and pain for 1 year with new right
arm atrophy and weakness for 2 weeks

EXAM:
MRI CERVICAL SPINE WITHOUT CONTRAST
TECHNIQUE: Multiplanar, multisequence MR imaging of the cervical spine was
performed. No intravenous contrast was administered.

[Series 4: T1 · sagittal · 3.0mm · 0.70mm/px · 6 of 15 slices shown]
[im 1/15]
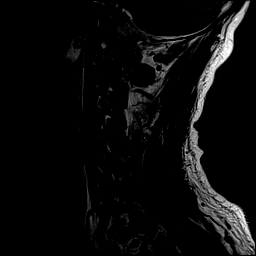
[im 3/15]
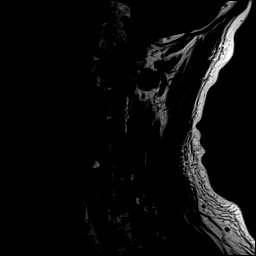
[im 6/15]
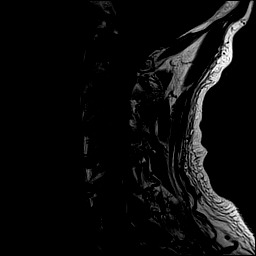
[im 9/15]
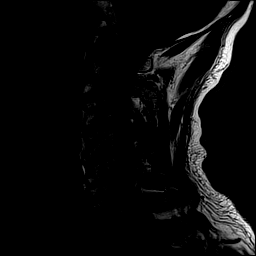
[im 12/15]
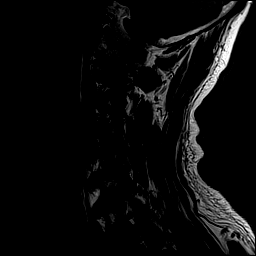
[im 15/15]
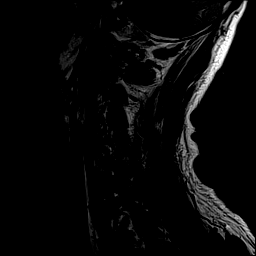

[Series 5: STIR · sagittal · 3.0mm · 0.35mm/px · 7 of 15 slices shown]
[im 1/15]
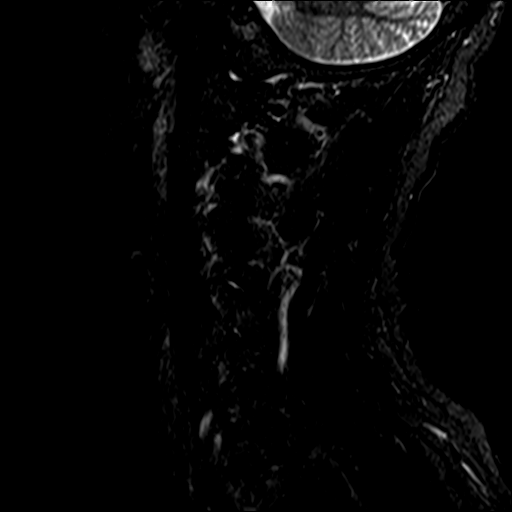
[im 3/15]
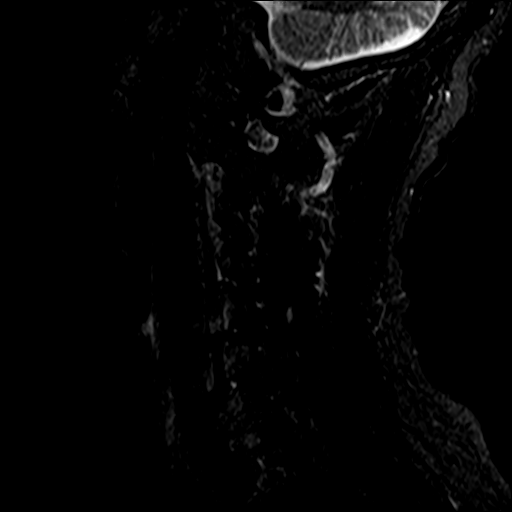
[im 5/15]
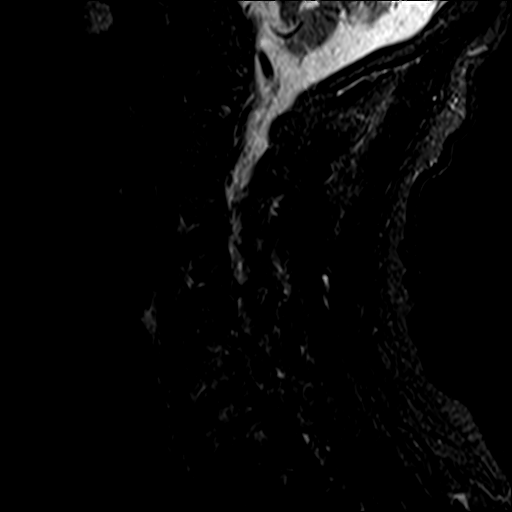
[im 8/15]
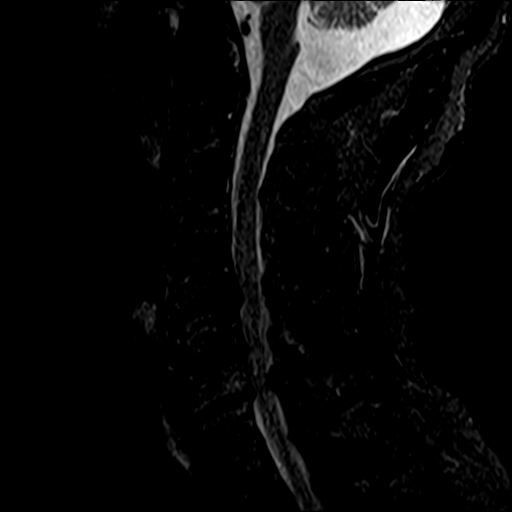
[im 10/15]
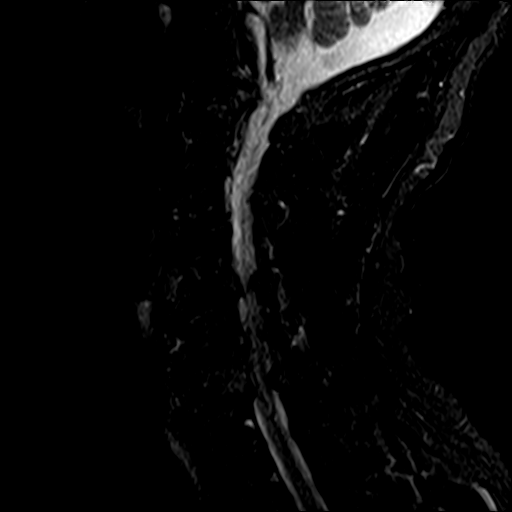
[im 12/15]
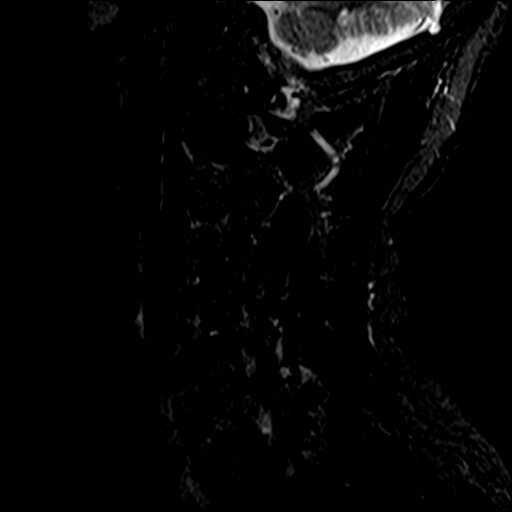
[im 15/15]
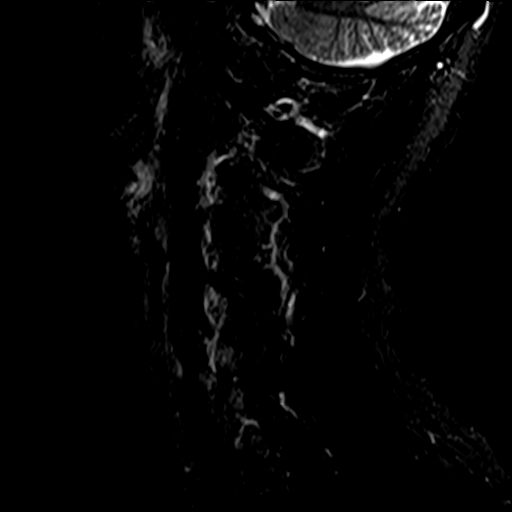

[Series 6: T2 · axial · 3.0mm · 0.70mm/px · z∈[-91,+23]mm · 8 of 30 slices shown (1 of 2)]
[im 1/30]
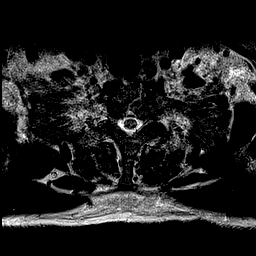
[im 5/30]
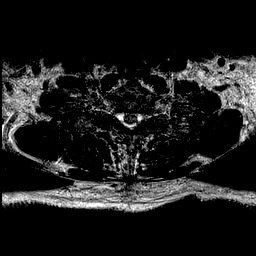
[im 9/30]
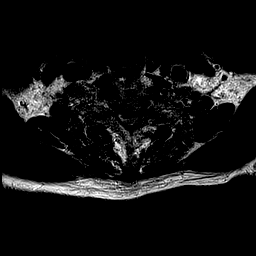
[im 14/30]
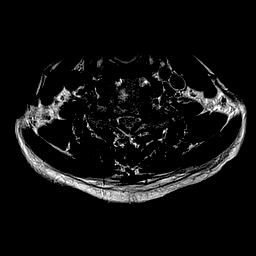
[im 16/30]
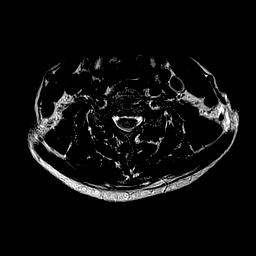
[im 21/30]
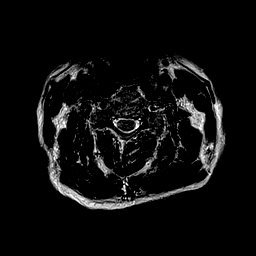
[im 25/30]
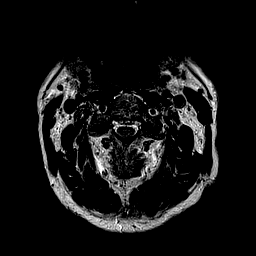
[im 30/30]
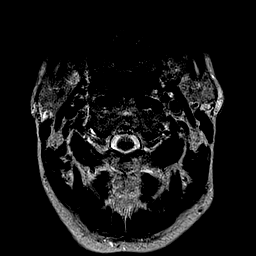

[Series 7: mpgr ax · axial · 3.0mm · 0.35mm/px · z∈[-91,-30]mm · 5 of 31 slices shown]
[im 1/31]
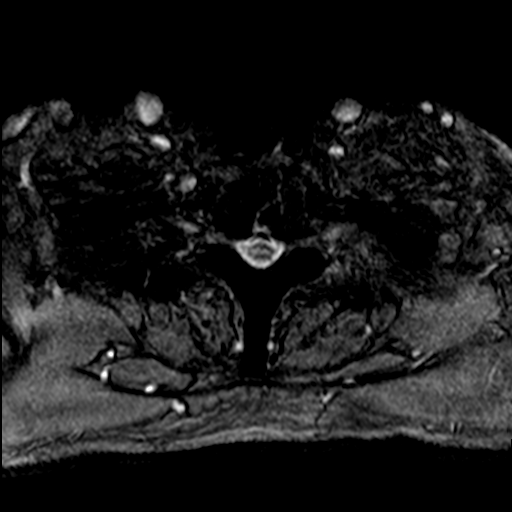
[im 5/31]
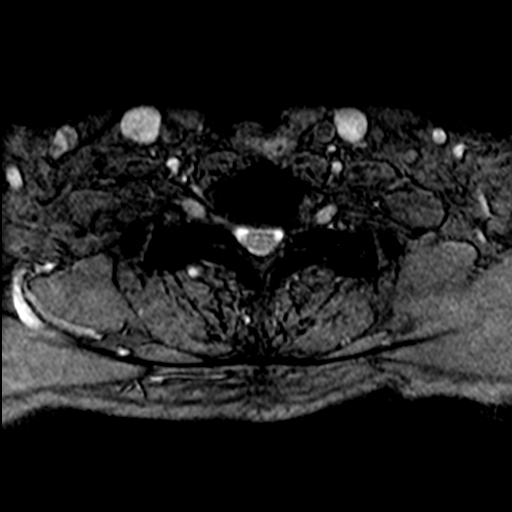
[im 10/31]
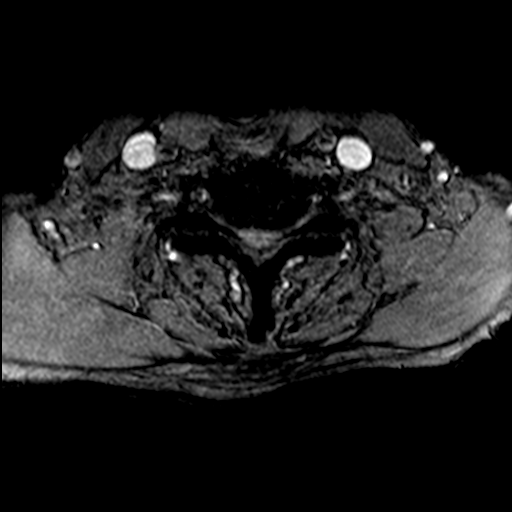
[im 14/31]
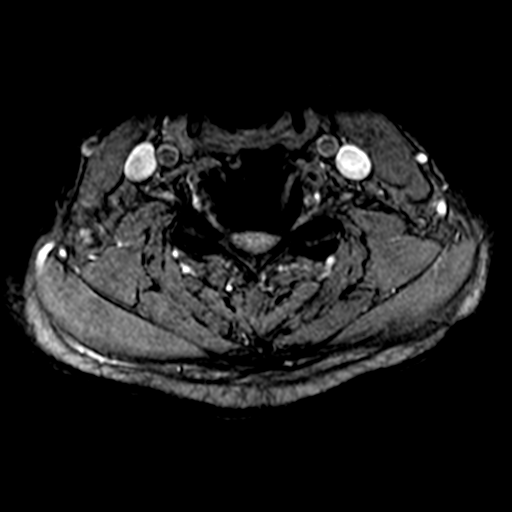
[im 17/31]
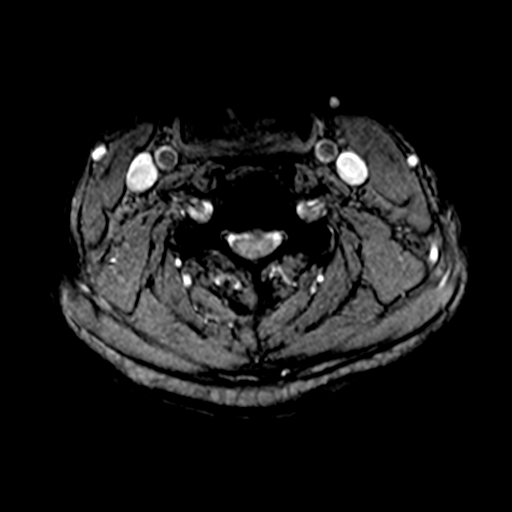

[Series 8: T2 · sagittal · 3.0mm · 0.70mm/px · 7 of 15 slices shown (2 of 2)]
[im 1/15]
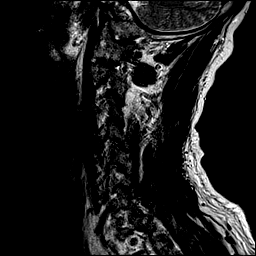
[im 3/15]
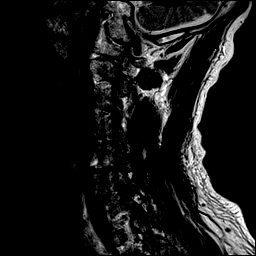
[im 5/15]
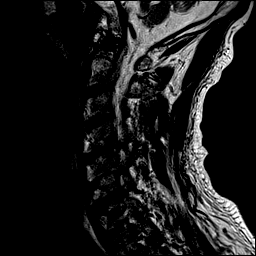
[im 8/15]
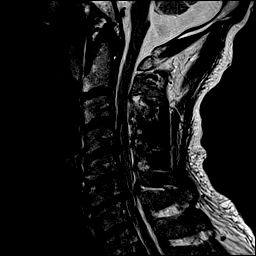
[im 10/15]
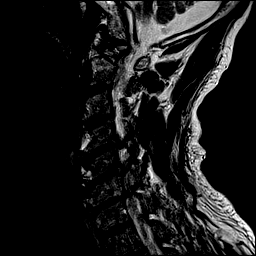
[im 12/15]
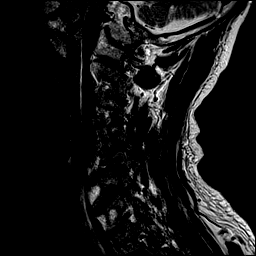
[im 15/15]
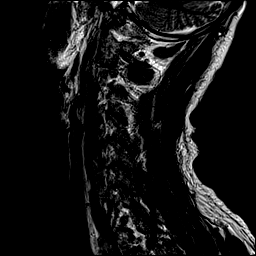

[33 of 48 positions shown; findings below may reference images not displayed]

FINDINGS: Alignment: Straightening without subluxation.

Vertebrae: No fracture, evidence of discitis, or bone lesion.

Cord: Degenerative cord flattening described below. No cord signal
abnormality.

Posterior Fossa, vertebral arteries, paraspinal tissues: Negative

Disc levels:

C2-3: Unremarkable.

C3-4: Mild disc narrowing. Bilateral uncovertebral ridging and mild
foraminal narrowing.

C4-5: Disc narrowing with endplate and uncovertebral ridging.
Central disc protrusion suggested on sagittal images. Mild
ligamentum flavum thickening. Advanced biforaminal impingement.
Spinal stenosis with cord flattening.

C5-6: Degenerative disc narrowing with endplate and uncovertebral
ridging. Biforaminal C6 compression. Spinal stenosis with ventral
cord flattening.

C6-7: Disc narrowing with endplate and uncovertebral ridging.
Ligamentum flavum buckling. Spinal stenosis with cord flattening.
Biforaminal impingement.

C7-T1:Unremarkable.
IMPRESSION: Degenerative disease causes spinal stenosis with cord flattening at
C4-5 to C6-7. Advanced biforaminal impingement at the same levels.

## 2019-07-20 IMAGING — CR DG CERVICAL SPINE 2 OR 3 VIEWS
1 series · 1 of 1 positions shown · non-contrast
Comparison: 04/08/2018

CLINICAL DATA: Cervical decompression

EXAM:
CERVICAL SPINE - 2-3 VIEW

[xtable lateral]
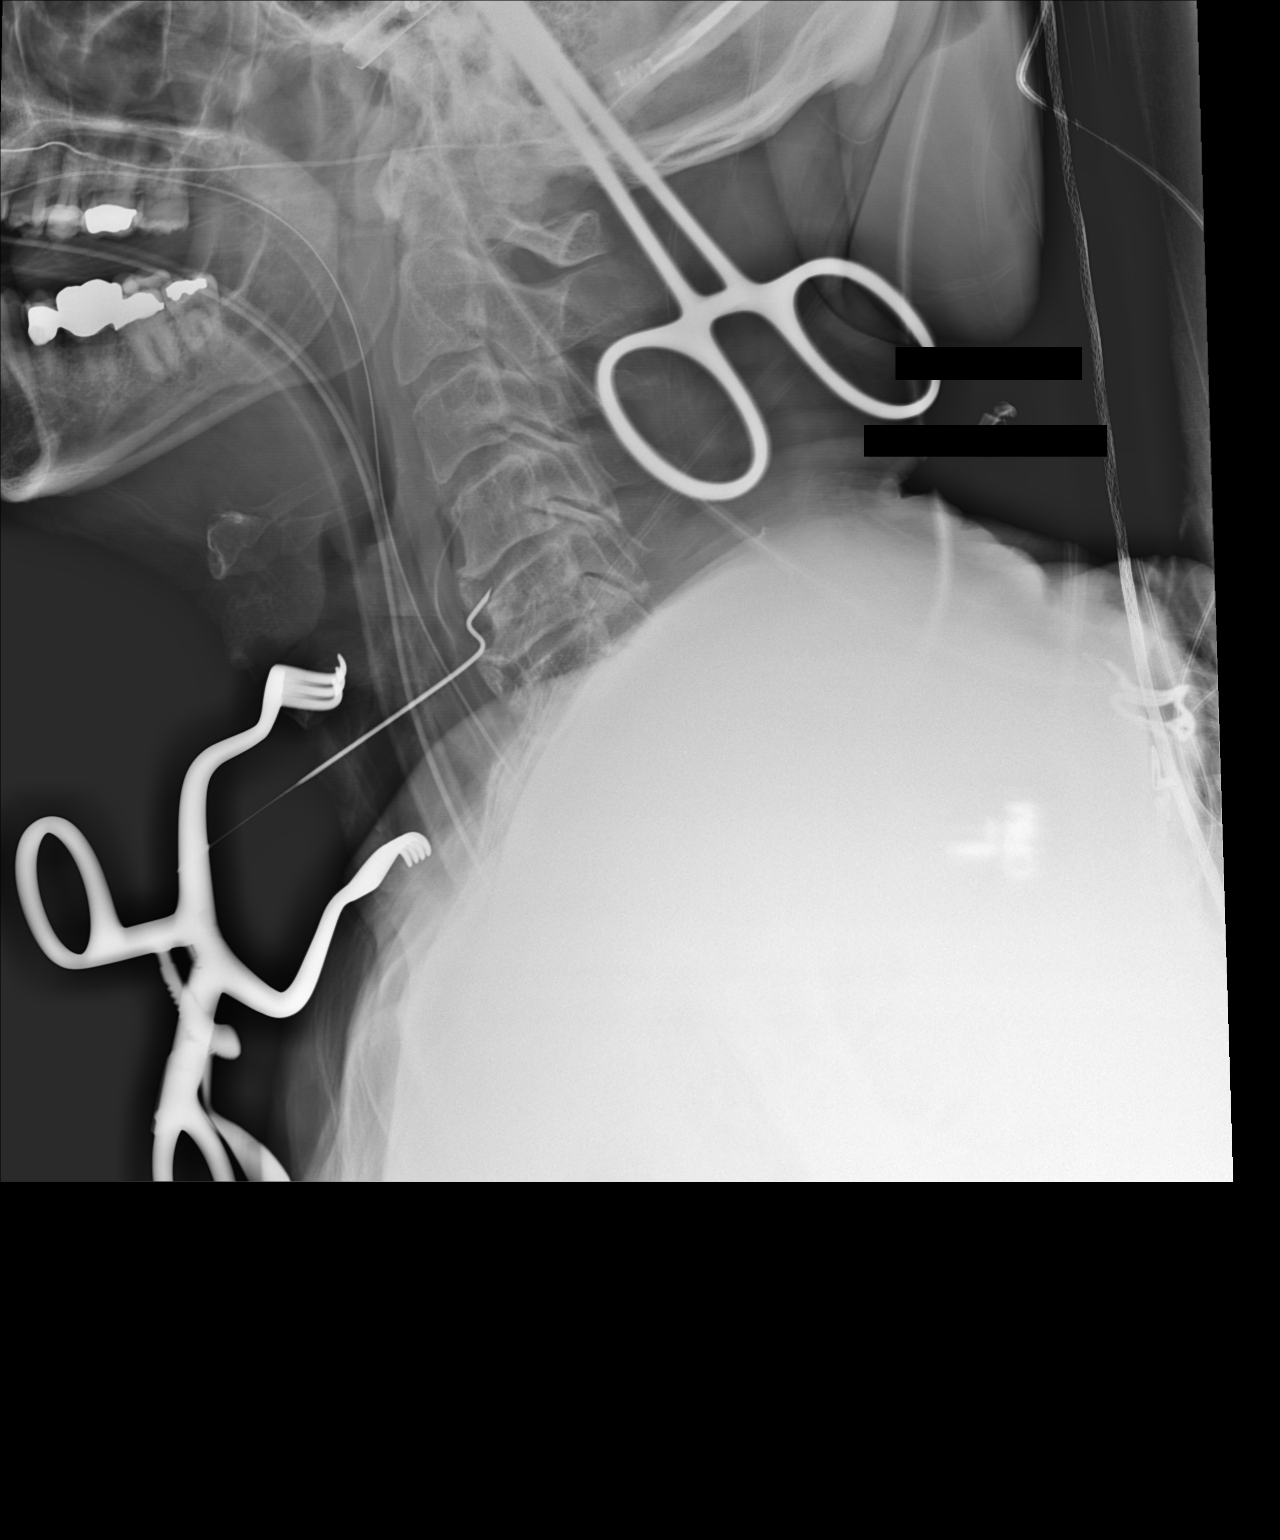

[1 of 1 positions shown; findings below may reference images not displayed]

FINDINGS: Two lateral films were obtained intraoperatively during cervical
fusion. The initial film demonstrates an instrument in the anterior
aspect of the C4-5 disc space.

Subsequent film demonstrates evidence of interbody fusion and
anterior fixation at C4-5, C5-6 and C6-7.
IMPRESSION: Cervical fusion.

## 2019-08-08 DIAGNOSIS — Z1159 Encounter for screening for other viral diseases: Secondary | ICD-10-CM | POA: Diagnosis not present

## 2019-09-08 DIAGNOSIS — Z131 Encounter for screening for diabetes mellitus: Secondary | ICD-10-CM | POA: Diagnosis not present

## 2019-09-08 DIAGNOSIS — Z0000 Encounter for general adult medical examination without abnormal findings: Secondary | ICD-10-CM | POA: Diagnosis not present

## 2019-09-08 DIAGNOSIS — Z125 Encounter for screening for malignant neoplasm of prostate: Secondary | ICD-10-CM | POA: Diagnosis not present

## 2019-09-08 DIAGNOSIS — Z79899 Other long term (current) drug therapy: Secondary | ICD-10-CM | POA: Diagnosis not present

## 2019-09-08 DIAGNOSIS — E7800 Pure hypercholesterolemia, unspecified: Secondary | ICD-10-CM | POA: Diagnosis not present

## 2019-09-08 DIAGNOSIS — G2581 Restless legs syndrome: Secondary | ICD-10-CM | POA: Diagnosis not present

## 2019-09-08 DIAGNOSIS — N401 Enlarged prostate with lower urinary tract symptoms: Secondary | ICD-10-CM | POA: Diagnosis not present

## 2019-09-08 DIAGNOSIS — M5137 Other intervertebral disc degeneration, lumbosacral region: Secondary | ICD-10-CM | POA: Diagnosis not present

## 2019-09-08 DIAGNOSIS — Z0189 Encounter for other specified special examinations: Secondary | ICD-10-CM | POA: Diagnosis not present

## 2019-09-25 DIAGNOSIS — H5213 Myopia, bilateral: Secondary | ICD-10-CM | POA: Diagnosis not present

## 2020-06-18 ENCOUNTER — Ambulatory Visit: Payer: Medicare Other | Attending: Internal Medicine

## 2020-06-18 DIAGNOSIS — Z23 Encounter for immunization: Secondary | ICD-10-CM

## 2020-06-18 NOTE — Progress Notes (Signed)
   Covid-19 Vaccination Clinic  Name:  Randall ALWIN, MD    MRN: [id number] DOB: [birth year]  06/18/2020  Mr. Randall was observed post Covid-19 immunization for 15 minutes without incident. He was provided with Vaccine Information Sheet and instruction to access the V-Safe system.   [name] was instructed to call 911 with any severe reactions post vaccine: Marland Kitchen Difficulty breathing  . Swelling of face and throat  . A fast heartbeat  . A bad rash all over body  . Dizziness and weakness

## 2021-07-25 ENCOUNTER — Other Ambulatory Visit: Payer: Self-pay | Admitting: Neurosurgery

## 2021-07-25 DIAGNOSIS — R29898 Other symptoms and signs involving the musculoskeletal system: Secondary | ICD-10-CM

## 2021-07-31 ENCOUNTER — Ambulatory Visit
Admission: RE | Admit: 2021-07-31 | Discharge: 2021-07-31 | Disposition: A | Discharge status: Home / Self Care | Payer: Medicare Other | Source: Ambulatory Visit | Attending: Neurosurgery | Admitting: Neurosurgery

## 2021-07-31 ENCOUNTER — Other Ambulatory Visit: Payer: Self-pay

## 2021-07-31 DIAGNOSIS — R29898 Other symptoms and signs involving the musculoskeletal system: Secondary | ICD-10-CM | POA: Diagnosis present

## 2021-07-31 MED ORDER — GADOBUTROL 1 MMOL/ML IV SOLN
10.0000 mL | Freq: Once | INTRAVENOUS | Status: AC | PRN
  Administered 2021-07-31: 10 mL via INTRAVENOUS

## 2021-08-02 ENCOUNTER — Other Ambulatory Visit: Payer: Self-pay | Admitting: Neurosurgery

## 2021-08-02 ENCOUNTER — Other Ambulatory Visit (HOSPITAL_BASED_OUTPATIENT_CLINIC_OR_DEPARTMENT_OTHER): Payer: Self-pay | Admitting: Neurosurgery

## 2021-08-02 DIAGNOSIS — M25552 Pain in left hip: Secondary | ICD-10-CM

## 2021-08-11 ENCOUNTER — Ambulatory Visit
Admission: RE | Admit: 2021-08-11 | Discharge: 2021-08-11 | Disposition: A | Discharge status: Home / Self Care | Payer: Medicare Other | Source: Ambulatory Visit | Attending: Neurosurgery | Admitting: Neurosurgery

## 2021-08-11 ENCOUNTER — Other Ambulatory Visit: Payer: Self-pay

## 2021-08-11 DIAGNOSIS — M25552 Pain in left hip: Secondary | ICD-10-CM

## 2021-08-31 ENCOUNTER — Ambulatory Visit: Payer: Medicare Other | Attending: Internal Medicine | Admitting: Physical Therapy

## 2021-08-31 ENCOUNTER — Other Ambulatory Visit: Payer: Self-pay

## 2021-08-31 DIAGNOSIS — M5432 Sciatica, left side: Secondary | ICD-10-CM | POA: Diagnosis not present

## 2021-08-31 DIAGNOSIS — M4125 Other idiopathic scoliosis, thoracolumbar region: Secondary | ICD-10-CM | POA: Diagnosis present

## 2021-08-31 DIAGNOSIS — R2689 Other abnormalities of gait and mobility: Secondary | ICD-10-CM | POA: Diagnosis present

## 2021-08-31 DIAGNOSIS — M62838 Other muscle spasm: Secondary | ICD-10-CM | POA: Diagnosis present

## 2021-08-31 NOTE — Patient Instructions (Addendum)
  Lengthen Back rib by R  shoulder    Lie on L  side , pillow between knees and under head  Pull  arm overhead over mattress, grab the edge of mattress,pull it upward, drawing elbow away from ears  Breathing 10 reps  Open book (handout)  Lying on  _ side , rotating  __ only this week  Rotating onto pillow /yoga block  Pillow/ Block between knees  10 reps   ___  [employer] activity modification:   Cast with L UE some to balance the torque and promote L rotation of thorax   Decrease casting 200 x on R UE to 50-100  Add casting to 50-100 L   ___ /u with PCP re: sleep study to help rule out/in OSA in order to sleep quality / nocturia

## 2021-09-01 NOTE — Therapy (Signed)
[place] MAIN [facility] SERVICES [address] [place], [place], [address] Phone: [phone]   Fax:  [phone]  Physical Therapy Evaluation  Patient Details  Name: Randall NAKANISHI, MD MRN: [id number] Date of Birth: Jul 28, 1956 Referring Provider (PT): Sparks MD   Encounter Date: 08/31/2021   PT End of Session - 08/31/21 1145     Visit Number 1    Number of Visits 10    Date for PT Re-Evaluation 11/09/21    PT Start Time 1100    PT Stop Time 4097    PT Time Calculation (min) 90 min             Past Medical History:  Diagnosis Date   Atypical atrial flutter ([place])    Cervical spondylosis with myelopathy and radiculopathy    Enlarged prostate    Restless leg syndrome    Sleep-disordered breathing     Past Surgical History:  Procedure Laterality Date   ANTERIOR CERVICAL DECOMP/DISCECTOMY FUSION N/A 05/13/2018   Procedure: ANTERIOR CERVICAL DECOMPRESSION/DISCECTOMY FUSION , INTERBODY PROSTHESIS, PLATE/SCREWS CERVICAL FOUR- CERVICAL FIVE, CERVICAL FIVE- CERVICAL SIX, CERVICAL SIX- CERVICAL SEVEN;  Surgeon: Newman Pies, MD;  Location: [facility];  Service: Neurosurgery;  Laterality: N/A;   APPENDECTOMY     BACK SURGERY  2009   ELECTROPHYSIOLOGIC STUDY N/A 04/07/2016   Procedure: A-Flutter Ablation;  Surgeon: Deboraha Sprang, MD;  Location: [place] [facility];  Service: Cardiovascular;  Laterality: N/A;   HERNIA REPAIR Bilateral    2 left side, 1 on right inguinal   LUMBAR LAMINECTOMY/DECOMPRESSION MICRODISCECTOMY N/A 02/04/2014   Procedure: Lumbar Three-Four Laminectomy, Lumbar Two Laminotomy;  Surgeon: Ophelia Charter, MD;  Location: [place] [facility];  Service: Neurosurgery;  Laterality: N/A;  Lumbar Three-Four Laminectomy, Lumbar Two Laminotomy   TONSILLECTOMY      There were no vitals filed for this visit.    Subjective Assessment - 08/31/21 1114     Subjective 1) CLBP on L :  6 weeks ago, pt was standing in a boat flyfishing floating  down a river. All of the sudden, his LLE started aching and it radiated from L2-3 down side of the hip to anterior thigh. 7- 8/10. Within two days, the pain worsened 8-9/10 and pt could sleep.   The first 2 weeks after onset of pain, pt could barely walk > 90 ft  or stand before having to stop and sit. He felt giveaway  left leg as well due to the pain. A short course of prednisone helped but not completely.  Neurosurgeon was consulted,  MRI showed no acute  surgical pathology, just the significant DDD. Dr. Duayne Cal was consulted for possible hip pathology and the MRI showed moderate at most hip disease but nothing warranting hip replacement any time soon. MRI of the back findings lead to possible left L2-3 nerve irritation and inflammation. Slight bulging disc on R was present. Pt 's pain improved with rest and time.  Before this injury, pt started back working out in Jan 2022 with 6 days /wk elliptical 30-65 min with resistance with arms/ legs (gradually increasing resistance from 15-20 level of resistance with max being 25), yoga, UE weight lifting when standing, planks, downward dog , and lying on his back type of stretches.  Pt started doing stretches and he thinks he overdid with groin stretches.  Pt gradually doing deep squats while holding things, childs pose with knees as far as he could.  Personally, he thinks these two  stretches he overdid and relates this L LBP injury.   Current limitations include:  Standing for > 30 min, walking on elliptical for > 20 min 2/2 burning and radiating pain to L anterior thigh.  Pt feels like his both legs are heavy and he does not have the stamina ( L > R).  Throughout the day, he gets the burning discomfort 4/10 and on average 1-2 /10.  It was dramatically better than what it was.  Denied no loss of sensation to saddle area, no loss of control with bowel and bladder.     2) Urinary issues:   Pt has Hx of BPH and has noticed slower stream and more frequency at  night. 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 Pt feels rested in the morning. Nocturia: 2-3 x within 7 hours. Urge incontince has occurred if he waits too long.     Pertinent History 1 cervical surgery fusion C5-6, 2 LBP surgeries, discectomy L4-5 and spinal fusion L3-5 which provided complete relief. Pt has acute injury leading to the first lumbar surgery due to radiating down RLE and weakness. Pt had pseudo-claudication ( nerve compression bilaterally)  leading to second surgery. Meaninng tasks: flyfishing, short backpacking trip 2-3 miles w/ elevation, walking in [place] with family, playing on the floor wtih grandchildren. New Dx of HTN,                [facility] PT Assessment - 08/31/21 1151       Assessment   Medical Diagnosis LBP    Referring Provider (PT) Sparks MD      Precautions   Precautions None      Restrictions   Weight Bearing Restrictions No      Balance Screen   Has the patient fallen in the past 6 months No      [place] residence    Living Arrangements Spouse/significant other    [place] to enter    Home Layout Two level      Prior Function   Level of Independence Independent      Observation/Other Assessments   Scoliosis R shoulder / L iliac crest higher, L T/L junction convex curve (n Post Tx: levelled iliac crest, slight T/L junctino convex curve still present)      AROM   Overall AROM Comments Pre/PostTx:  R  sidebend 69 cm / 66 cm, L 71/ 67 cm (+ pain)   (digit III to floor),   L rotation limited more than R with + pain   POST Tx: no pain with L sidebend / rotation     Strength   Overall Strength Comments BLE 5/5      Palpation   Spinal mobility L hypomobile T10-12 , tenderness    SI assessment  supine: levelled ASIS, loweer patella/ malleoli on L    Palpation comment tight medtrap/ SA,  L , medial scapula tightness,       Ambulation/Gait   Gait velocity 1.56 m/s    Gait Comments limted posterior rotation of L shoulder                        Objective measurements completed on examination: See above findings.       [facility] Adult PT Treatment/Exercise - 09/01/21 0926       Ambulation/Gait   Gait velocity 1.56 m/s  Gait Comments limted posterior rotation of L shoulder      Therapeutic Activites    Other Therapeutic Activities explained repeated movement with casting fishing pole with fly fishing hobby is related to LBP/ slightly bulging disc on R, L radicular pain. Advised pt to decrease R with RUE casting motion, increase L UE casting to move in the opposite pattern for minimizing LBP and increase posterior rotation of LUE/ minimize scoliotic curve at T/L junction, advised to f/u with PCP re: sleep study to help rule out/in OSA in order to sleep quality / nocturia      Neuro Re-ed    Neuro Re-ed Details  cued for T/L junction stretches      Manual Therapy   Manual therapy comments STM/MWM at T/L junction , medial glide at T10/13                          PT Long Term Goals - 08/31/21 1122       PT LONG TERM GOAL #1   Title Pt will report being able to stand for > 30 min, walking on elliptical for > 20 min 2/2 burning and radiating pain to L anterior thigh.    Time 10    Period Weeks    Status New    Target Date 11/09/21      PT LONG TERM GOAL #2   Title Pt's FOTO score will improve from 59pts to > 67 pts in order improve QOL    Time 10    Period Weeks    Status New    Target Date 11/09/21      PT LONG TERM GOAL #3   Title Pt will get screened for sleep study to rule in/out OSA to improve sleep quality for pain management and nocturia    Time 4    Period Weeks    Status New    Target Date 09/28/21      PT LONG TERM GOAL #4   Title Pt will demo more posterior rotation of L UE in gait, less T/L junction convex , levelled pelvic girdle / shoulders in order  to minimize LBP and pain and return to standing and squat    Baseline L iliac crest higher, R shoulder higher    Time 6    Period Weeks    Status New    Target Date 10/13/21      PT LONG TERM GOAL #5   Title Pt will  be IND and compliant with casting practice with LUE to promote posterior rotation of thorax/ minimize LBP and continue flyfishing with less risk for injuries    Time 8    Period Weeks    Status New    Target Date 10/27/21                    Plan - 09/01/21 0931     Clinical Impression Statement   Pt is a  65 yo who presents with L LBP that radiates across lateral and medial thigh that started suddenly 6 weeks ago while flyfishing on a boat moving on a river.  This pain limits standing, walking, and squatting.   Pt also reports slowed urine stream and nocturia  2/2 BPH. Pt denied sudden loss of urinary / bowel function, paresthesia in saddle area.   Pt's musculoskeletal assessment revealed uneven pelvic girdle/ shoulder ( L iliac crest higher. R shoulder higher), T/L junction with L thoracic convex  curve,  limited spinal /pelvic mobility with pain occurring L sideflexion/ rotation, limited posterior rotation of trunk/ L UE in gait.   These are deficits that are likely related to repetitive movements associated with his practices with flyfishing. Pt reported he would practice casting with RUE which involved torsion of spine that has contributed to T/L convex curve in spine.  Hx of past neck and back surgeries , DDD are also contributing factors.   Regional interdependent approaches will yield greater benefits in pt's POC due to pt's medical Hx and the significant impact their Sx have had on their QOL.   Following Tx today which addressed T/L junction convex and hypomobility at thoracic segments, pt demo'd levelled pelvic girdle, more posterior rotation of trunk/ UE in gait, and reported no pain with L sidebend and rotation. Explained to pt how repeated movement with  casting fishing pole with fly fishing hobby is related to LBP/ slightly bulging disc on R, L radicular pain. Advised pt to decrease R with RUE casting motion, increase L UE casting to move in the opposite pattern for minimizing LBP and increase posterior rotation of LUE/ minimize scoliotic curve at T/L junction.  Plan to continue addressing deviations to spine and modify fitness routine.   Pt was advised to f/u with PCP re: sleep study to r/o or rule in OSA given pt's nocturia and lifelong interrupted sleep pattern.    Pt continues to benefit from skilled PT.     Comorbidities HTN, Past surgeries neck and back, BPH    Examination-Activity Limitations Locomotion Level;Sleep;Stand;Squat    Stability/Clinical Decision Making Evolving/Moderate complexity    Clinical Decision Making Moderate    Rehab Potential Good    PT Frequency 1x / week   10   PT Duration Other (comment)   10   PT Treatment/Interventions Therapeutic activities;Therapeutic exercise;Neuromuscular re-education;Patient/family education;Moist Heat;Cryotherapy;Manual techniques;Taping;Balance training;ADLs/Self Care Home Management;Gait training;Joint Manipulations;Scar mobilization    Consulted and Agree with Plan of Care Patient             Patient will benefit from skilled therapeutic intervention in order to improve the following deficits and impairments:  Abnormal gait, Decreased coordination, Decreased endurance, Decreased range of motion, Decreased activity tolerance, Decreased mobility, Decreased scar mobility, Decreased strength, Difficulty walking, Hypomobility, Increased muscle spasms, Impaired flexibility, Improper body mechanics, Postural dysfunction, Pain  Visit Diagnosis: Sciatica, left side  Other abnormalities of gait and mobility  Other idiopathic scoliosis, thoracolumbar region  Other muscle spasm     Problem List Patient Active Problem List   Diagnosis Date Noted   Cervical spondylosis with  myelopathy and radiculopathy 05/13/2018   Fever and chills 11/16/2017   Atypical atrial flutter ([place]) 04/07/2016   Paroxysmal atrial flutter ([place]) 03/27/2016   BPH with obstruction/lower urinary tract symptoms 07/14/2015   Disc disease, degenerative, lumbar or lumbosacral 07/14/2015   RLS (restless legs syndrome) 07/14/2015   Lumbar stenosis with neurogenic claudication 02/04/2014   Spinal stenosis of lumbar region 02/04/2014    Jerl Mina, PT 09/01/2021, 9:42 AM  [place], [place], [address] Phone: [phone]   Fax:  [phone]  Name: HEATHER STREEPER, MD MRN: [id number] Date of Birth: 13-Oct-1956

## 2021-09-13 ENCOUNTER — Other Ambulatory Visit: Payer: Self-pay

## 2021-09-13 ENCOUNTER — Ambulatory Visit: Payer: Medicare Other | Admitting: Physical Therapy

## 2021-09-13 DIAGNOSIS — M5432 Sciatica, left side: Secondary | ICD-10-CM

## 2021-09-13 DIAGNOSIS — R2689 Other abnormalities of gait and mobility: Secondary | ICD-10-CM

## 2021-09-13 DIAGNOSIS — M4125 Other idiopathic scoliosis, thoracolumbar region: Secondary | ICD-10-CM

## 2021-09-13 DIAGNOSIS — M62838 Other muscle spasm: Secondary | ICD-10-CM

## 2021-09-13 NOTE — Patient Instructions (Signed)
Stretches : (Cuing provided for proper alignment)  Instructions start with Strap on R    Stretches for your legs: LAYING on Back Use upper arms and elbows for stability when pulling strap Opposite knee bent and foot firm in align with hip   Strap on ballmound:  Hip socket  _strap, L knee bent, R ballmound against strap and spread toes, rolling foot 15 deg out and in across midline.  10 reps each side   Hamstring _knee bends  10 reps  With knee pointing straight ( slightly to outside to minimize snapping sensation)      10 reps with knee pointing out towards armpit ( notice the stretch in the medial hamstring muscle)     IT band  _scoot hips to R, cross R leg over L and straighten knee with strap on ballmound,   Bend knee back and forth 5x    Quad in sidelying _strap around the ankle, pulling ankle towards buttocks  Bottom leg firm and stabilization with knee bent  Adductors and pelvic floor ( [place]) : Randall Shannon are wide towards armpits, sole of feet towards ceiling   On your back again:  Strap under R thigh Hip abductors ( figure 4)      ,L ankle over R thigh ( stretching L glut)     5  Breaths

## 2021-09-14 NOTE — Therapy (Signed)
[place] MAIN [facility] SERVICES [address] [place], [place], [address] Phone: [phone]   Fax:  [phone]  Physical Therapy Treatment  Patient Details  Name: TALIB HEADLEY, MD MRN: [id number] Date of Birth: [birth year] Referring Provider (PT): Sparks MD   Encounter Date: 09/13/2021   PT End of Session - 09/14/21 1115     Visit Number 2    Number of Visits 10    Date for PT Re-Evaluation 11/09/21    PT Start Time 4854    PT Stop Time 1500    PT Time Calculation (min) 57 min             Past Medical History:  Diagnosis Date   Atypical atrial flutter ([place])    Cervical spondylosis with myelopathy and radiculopathy    Enlarged prostate    Restless leg syndrome    Sleep-disordered breathing     Past Surgical History:  Procedure Laterality Date   ANTERIOR CERVICAL DECOMP/DISCECTOMY FUSION N/A 05/13/2018   Procedure: ANTERIOR CERVICAL DECOMPRESSION/DISCECTOMY FUSION , INTERBODY PROSTHESIS, PLATE/SCREWS CERVICAL FOUR- CERVICAL FIVE, CERVICAL FIVE- CERVICAL SIX, CERVICAL SIX- CERVICAL SEVEN;  Surgeon: Newman Pies, MD;  Location: [place];  Service: Neurosurgery;  Laterality: N/A;   APPENDECTOMY     BACK SURGERY  2009   ELECTROPHYSIOLOGIC STUDY N/A 04/07/2016   Procedure: A-Flutter Ablation;  Surgeon: Deboraha Sprang, MD;  Location: [place] CV LAB;  Service: Cardiovascular;  Laterality: N/A;   HERNIA REPAIR Bilateral    2 left side, 1 on right inguinal   LUMBAR LAMINECTOMY/DECOMPRESSION MICRODISCECTOMY N/A 02/04/2014   Procedure: Lumbar Three-Four Laminectomy, Lumbar Two Laminotomy;  Surgeon: Ophelia Charter, MD;  Location: [place] [facility];  Service: Neurosurgery;  Laterality: N/A;  Lumbar Three-Four Laminectomy, Lumbar Two Laminotomy   TONSILLECTOMY      There were no vitals filed for this visit.   Subjective Assessment - 09/13/21 1410     Subjective Pt reports LLE pain getting better and no longer radiates down. It is almost  gone. Pt did the elliptical for 30 min and there was no pain. Five days ago, pt woke up with pulled mm pain located at R from [facility] to above knee  . Certain movements exacerbated it : lifting leg and cross leg. Over the next two days, it got really bad. It took at least 20 min to put on socks and shoes. He almost needed someone to help him. That night, pt used a TENS unit for 2 hours and since then, it is signficantly better.  Pt still took Gabapentin and Alleve. Pt will be getting a sleep unit tomorrow and undergo his sleep study.    Pertinent History 1 cervical surgery fusion C5-6, 2 LBP surgeries, discectomy L4-5 and spinal fusion L3-5 which provided complete relief. Pt has acute injury leading to the first lumbar surgery due to radiating down RLE and weakness. Pt had pseudo-claudication ( nerve compression bilaterally)  leading to second surgery. Meaninng tasks: flyfishing, short backpacking trip 2-3 miles w/ elevation, walking in [place] with family, playing on the floor wtih grandchildren. New Dx of HTN,                [facility] PT Assessment - 09/14/21 1116       AROM   Overall AROM Comments hip ext L -15 deg, R hip -30 deg      Strength   Overall Strength Comments MMT 3/5 PF single LE 20 reps ,B hip abd 5/5, hip  ext R 3+/5, L 4/5      Flexibility   Hamstrings L 40 deg/ R 60 deg measured in hip flex -trunk 90deg      Palpation   Spinal mobility less deviation at T/L junction    SI assessment  R SIJ hypomobile, limited in FADDIR ( improved post Tx)                           [facility] Adult PT Treatment/Exercise - 09/14/21 1116       Ambulation/Gait   Gait Comments Posterior L UE rotation , more reciprocal pattern , limited mobility lumbar      Neuro Re-ed    Neuro Re-ed Details  cued for lower kinetic chain stretches with strap      Manual Therapy   Manual therapy comments PA mob at Sacrum to promote mobility, STM/MWM along leg mm to increase ITBAND mobility B and areas  noted in assessment                          PT Long Term Goals - 08/31/21 1122       PT LONG TERM GOAL #1   Title Pt will report being able to stand for > 30 min, walking on elliptical for > 20 min 2/2 burning and radiating pain to L anterior thigh.    Time 10    Period Weeks    Status New    Target Date 11/09/21      PT LONG TERM GOAL #2   Title Pt's FOTO score will improve from 59pts to > 67 pts in order improve QOL    Time 10    Period Weeks    Status New    Target Date 11/09/21      PT LONG TERM GOAL #3   Title Pt will get screened for sleep study to rule in/out OSA to improve sleep quality for pain management and nocturia    Time 4    Period Weeks    Status New    Target Date 09/28/21      PT LONG TERM GOAL #4   Title Pt will demo more posterior rotation of L UE in gait, less T/L junction convex , levelled pelvic girdle / shoulders in order to minimize LBP and pain and return to standing and squat    Baseline L iliac crest higher, R shoulder higher    Time 6    Period Weeks    Status New    Target Date 10/13/21      PT LONG TERM GOAL #5   Title Pt will  be IND and compliant with casting practice with LUE to promote posterior rotation of thorax/ minimize LBP and continue flyfishing with less risk for injuries    Time 8    Period Weeks    Status New    Target Date 10/27/21                   Plan - 09/14/21 1116     Clinical Impression Statement Pt showed good carry over with less deviation and asymmetry at T/ L junction . Pt has been compliant with practicing his casting motions with fly fishing on L side which will help with minimizing repeated motions mm asymmetries.   Focused on optimizing SIJ mobility and global mm of lower kinetic chain today with manual Tx and HEP. Pt showed limited hamstring /  ITband flexibility . Plan for more mobility exercises and manual Tx  at next session. Pt continues to benefit from skilled PT    Comorbidities HTN, Past surgeries neck and back, BPH    Examination-Activity Limitations Locomotion Level;Sleep;Stand;Squat    Stability/Clinical Decision Making Evolving/Moderate complexity    Rehab Potential Good    PT Frequency 1x / week   10   PT Duration Other (comment)   10   PT Treatment/Interventions Therapeutic activities;Therapeutic exercise;Neuromuscular re-education;Patient/family education;Moist Heat;Cryotherapy;Manual techniques;Taping;Balance training;ADLs/Self Care Home Management;Gait training;Joint Manipulations;Scar mobilization    Consulted and Agree with Plan of Care Patient             Patient will benefit from skilled therapeutic intervention in order to improve the following deficits and impairments:  Abnormal gait, Decreased coordination, Decreased endurance, Decreased range of motion, Decreased activity tolerance, Decreased mobility, Decreased scar mobility, Decreased strength, Difficulty walking, Hypomobility, Increased muscle spasms, Impaired flexibility, Improper body mechanics, Postural dysfunction, Pain  Visit Diagnosis: Sciatica, left side  Other abnormalities of gait and mobility  Other idiopathic scoliosis, thoracolumbar region  Other muscle spasm     Problem List Patient Active Problem List   Diagnosis Date Noted   Cervical spondylosis with myelopathy and radiculopathy 05/13/2018   Fever and chills 11/16/2017   Atypical atrial flutter ([place]) 04/07/2016   Paroxysmal atrial flutter ([place]) 03/27/2016   BPH with obstruction/lower urinary tract symptoms 07/14/2015   Disc disease, degenerative, lumbar or lumbosacral 07/14/2015   RLS (restless legs syndrome) 07/14/2015   Lumbar stenosis with neurogenic claudication 02/04/2014   Spinal stenosis of lumbar region 02/04/2014    Jerl Mina, PT 09/14/2021, 11:19 AM  [place], [place], [address] Phone: [phone]    Fax:  [phone]  Name: MEARL OLVER, MD MRN: [id number] Date of Birth: [birth year]

## 2021-09-21 ENCOUNTER — Ambulatory Visit: Payer: Medicare Other | Admitting: Physical Therapy

## 2021-09-28 ENCOUNTER — Ambulatory Visit: Payer: Medicare Other | Attending: Internal Medicine | Admitting: Physical Therapy

## 2021-09-28 ENCOUNTER — Other Ambulatory Visit: Payer: Self-pay

## 2021-09-28 DIAGNOSIS — M62838 Other muscle spasm: Secondary | ICD-10-CM

## 2021-09-28 DIAGNOSIS — M533 Sacrococcygeal disorders, not elsewhere classified: Secondary | ICD-10-CM | POA: Diagnosis not present

## 2021-09-28 DIAGNOSIS — R2689 Other abnormalities of gait and mobility: Secondary | ICD-10-CM | POA: Diagnosis present

## 2021-09-28 DIAGNOSIS — M5432 Sciatica, left side: Secondary | ICD-10-CM

## 2021-09-28 DIAGNOSIS — M4125 Other idiopathic scoliosis, thoracolumbar region: Secondary | ICD-10-CM

## 2021-09-28 NOTE — Patient Instructions (Signed)
Withhold past stretches with strap  __  Seated hamstring stretch , graded movement with toes up  __  Stretch for pelvic floor    On belly: Riding horse edge of mattress  knee bent like riding a horse, move knee towards armpit and out  10 reps  hands and knees: childs pose rocking   __  Stretch hip flexor and calves:  One hand onn wall, slide outer foot back hip width apart  20 reps   Tilting forward        __  Standing with equal weight on ballmounds and heels to minimize anterior tilt . Lordosis  ( Dropping tail down)    Sitting not slumped  Feet on ground

## 2021-09-29 NOTE — Therapy (Signed)
[place] MAIN [facility] SERVICES [address] [place], [place], [address] Phone: [phone]   Fax:  [phone]  Physical Therapy Treatment  Patient Details  Name: Randall Shannon, Randall Shannon MRN: [id number] Date of Birth: 1956/08/31 Referring Provider (PT): Randall Shannon   Encounter Date: 09/28/2021   PT End of Session - 09/29/21 0821     Visit Number 3    Number of Visits 10    Date for PT Re-Evaluation 11/09/21    PT Start Time 1205    PT Stop Time 1300    PT Time Calculation (min) 55 min    Activity Tolerance Patient tolerated treatment well    Behavior During Therapy Randall Shannon for tasks assessed/performed             Past Medical History:  Diagnosis Date   Atypical atrial flutter ([place])    Cervical spondylosis with myelopathy and radiculopathy    Enlarged prostate    Restless leg syndrome    Sleep-disordered breathing     Past Surgical History:  Procedure Laterality Date   ANTERIOR CERVICAL DECOMP/DISCECTOMY FUSION N/A 05/13/2018   Procedure: ANTERIOR CERVICAL DECOMPRESSION/DISCECTOMY FUSION , INTERBODY PROSTHESIS, PLATE/SCREWS CERVICAL FOUR- CERVICAL FIVE, CERVICAL FIVE- CERVICAL SIX, CERVICAL SIX- CERVICAL SEVEN;  Surgeon: Newman Pies, Randall Shannon;  Location: [employer];  Service: Neurosurgery;  Laterality: N/A;   APPENDECTOMY     BACK SURGERY  2009   ELECTROPHYSIOLOGIC STUDY N/A 04/07/2016   Procedure: A-Flutter Ablation;  Surgeon: Deboraha Sprang, Randall Shannon;  Location: [place] [facility];  Service: Cardiovascular;  Laterality: N/A;   HERNIA REPAIR Bilateral    2 left side, 1 on right inguinal   LUMBAR LAMINECTOMY/DECOMPRESSION MICRODISCECTOMY N/A 02/04/2014   Procedure: Lumbar Three-Four Laminectomy, Lumbar Two Laminotomy;  Surgeon: Ophelia Charter, Randall Shannon;  Location: [place] [facility];  Service: Neurosurgery;  Laterality: N/A;  Lumbar Three-Four Laminectomy, Lumbar Two Laminotomy   TONSILLECTOMY      There were no vitals filed for this visit.   Subjective  Assessment - 09/28/21 1222     Subjective Pt reported no increased pain after last session until 3-4 days afterwards.  Pt did the stretches from last session and it  did not aggravate anything at all until this pain came on 3 days later.  Pt noticed radiating pain from buttocks to back of leg and foot. Worst with lounging / squat. Pain today 6/10 getting to 9/10.  Taking Gabapentine/ tylenol./ Ibuprofen helps relieve pain after 3-4 hours. No new changes in activiteis in those 3 days.    Pertinent History 1 cervical surgery fusion C5-6, 2 LBP surgeries, discectomy L4-5 and spinal fusion L3-5 which provided complete relief. Pt has acute injury leading to the first lumbar surgery due to radiating down RLE and weakness. Pt had pseudo-claudication ( nerve compression bilaterally)  leading to second surgery. Meaninng tasks: flyfishing, short backpacking trip 2-3 miles w/ elevation, walking in [place] with family, playing on the floor wtih grandchildren. New Dx of HTN,                [facility] PT Assessment - 09/29/21 0953       Observation/Other Assessments   Observations counter stretch, minisquat , forward flexion with radiating pain at 90deg trunk to thigh, post Tx, onset of pain with forward flexion trunk to thigh 70deg , indicating hamstrings limitation still impacting radiating pain.    Seated slump test pre Tx, immediately 90-90 deg,  post Tx forward flexion ~10-20 deg flexion before onset  AROM   Overall AROM Comments R hip ext insidelying: pre Tx: 7 deg, Post Tx 10 deg      Palpation   Spinal mobility sideflexion B, forward flexion, slump test  causes radicular pain, rotation at thoracic  does not. Standing with L sideflexion /preferred standing posture with anterior tilt of pelvis with increased pain, cued for neutral pelvis with less  increased pain.     Palpation comment signficant tightness along posterior pelvic floor, obt int, coccygeus, ischial rami attachments, hip abductor mm R                            [facility] Adult PT Treatment/Exercise - 09/29/21 0951       Therapeutic Activites    Other Therapeutic Activities withheld past HEP, explaiend the importance of pelvic propioception / seated/ standing posture to minimize radicular pain, explained the importance of graded movement with hamstring stretch , assessed with repeated movements and pelvic approaches      Neuro Re-ed    Neuro Re-ed Details  cued for graded movement with seated hamstring stretch without strap. CKC, standing h ip flexor stretch      Manual Therapy   Manual therapy comments STM/MWM at hip abductor mm, posterior pelvic floor, coccygeus/ obt internus R                          PT Long Term Goals - 08/31/21 1122       PT LONG TERM GOAL #1   Title Pt will report being able to stand for > 30 min, walking on elliptical for > 20 min 2/2 burning and radiating pain to L anterior thigh.    Time 10    Period Weeks    Status New    Target Date 11/09/21      PT LONG TERM GOAL #2   Title Pt's FOTO score will improve from 59pts to > 67 pts in order improve QOL    Time 10    Period Weeks    Status New    Target Date 11/09/21      PT LONG TERM GOAL #3   Title Pt will get screened for sleep study to rule in/out OSA to improve sleep quality for pain management and nocturia    Time 4    Period Weeks    Status New    Target Date 09/28/21      PT LONG TERM GOAL #4   Title Pt will demo more posterior rotation of L UE in gait, less T/L junction convex , levelled pelvic girdle / shoulders in order to minimize LBP and pain and return to standing and squat    Baseline L iliac crest higher, R shoulder higher    Time 6    Period Weeks    Status New    Target Date 10/13/21      PT LONG TERM GOAL #5   Title Pt will  be IND and compliant with casting practice with LUE to promote posterior rotation of thorax/ minimize LBP and continue flyfishing with less risk for injuries     Time 8    Period Weeks    Status New    Target Date 10/27/21                   Plan - 09/29/21 0951     Clinical Impression Statement Pt had R radicular pain down  RLE 3 days after last session. Pain did not  occur with stretches but pt was advised to continue to hold off on doing them. Pt has been able to manage with medications.   Noted these concordant signs through assessment and subsequent changes with Tx:                 - radicular Sx occurred with standing L sideflexion but not with seated L side flexion which indicates nerve compression is occurring lower tract of sciatic nn which guided manual Tx at R posterior pelvic floor/inferior hip abductor mm. After Tx, decreased in pain  --standing with L sideflexion /preferred standing posture with anterior tilt of pelvis with increased pain, cued for neutral pelvis with less  increased pain.   -slump test when trunk -thigh angle was at 90 deg but after Tx, onset of radicular pain occurred at 70 deg trunk to thigh angle  -repeated movements did not make any changes  -forward flexion with mini squat position at counter caused Sx, post Tx, no change which indicates more manual Tx is needed to address lower tract of sciatic nn at hamstring/calf.           -showed improvement of slightly increased hip extension AROM compared to last session.                                                                                                                                   Provided graded movement to lengthen hamstring in seated position and maintaining 90-90 deg trunk-thigh angle and to withhold from stretches with strap in supine position. Pt performed with no pain. Added hip flexor/calf stretch in standing position which did not cause pain.   Plan to continue to address mm tensions at lower tract of sciatic and continue to assess at SIJ which remains tight.   Pt will be seeing a pain specialist. Pt was tested positive for OSA and  will be getting CPAP soon.    Pt continues to benefit from skilled PT.                                         Comorbidities HTN, Past surgeries neck and back, BPH    Examination-Activity Limitations Locomotion Level;Sleep;Stand;Squat    Stability/Clinical Decision Making Evolving/Moderate complexity    Rehab Potential Good    PT Frequency 1x / week   10   PT Duration Other (comment)   10   PT Treatment/Interventions Therapeutic activities;Therapeutic exercise;Neuromuscular re-education;Patient/family education;Moist Heat;Cryotherapy;Manual techniques;Taping;Balance training;ADLs/Self Care Home Management;Gait training;Joint Manipulations;Scar mobilization    Consulted and Agree with Plan of Care Patient             Patient will benefit from skilled therapeutic intervention in order to improve the following deficits and impairments:  Abnormal gait, Decreased coordination, Decreased endurance, Decreased range  of motion, Decreased activity tolerance, Decreased mobility, Decreased scar mobility, Decreased strength, Difficulty walking, Hypomobility, Increased muscle spasms, Impaired flexibility, Improper body mechanics, Postural dysfunction, Pain  Visit Diagnosis: Sciatica, left side  Other abnormalities of gait and mobility  Other idiopathic scoliosis, thoracolumbar region  Other muscle spasm     Problem List Patient Active Problem List   Diagnosis Date Noted   Cervical spondylosis with myelopathy and radiculopathy 05/13/2018   Fever and chills 11/16/2017   Atypical atrial flutter ([place]) 04/07/2016   Paroxysmal atrial flutter ([place]) 03/27/2016   BPH with obstruction/lower urinary tract symptoms 07/14/2015   Disc disease, degenerative, lumbar or lumbosacral 07/14/2015   RLS (restless legs syndrome) 07/14/2015   Lumbar stenosis with neurogenic claudication 02/04/2014   Spinal stenosis of lumbar region 02/04/2014    Jerl Mina, PT 09/29/2021, 1:43 PM  [place] MAIN [facility] SERVICES [place], [place], [address] Phone: [phone]   Fax:  [phone]  Name: Randall BURLING, Randall Shannon MRN: [id number] Date of Birth: 1956-03-31

## 2021-10-04 ENCOUNTER — Other Ambulatory Visit: Payer: Self-pay

## 2021-10-04 ENCOUNTER — Ambulatory Visit: Payer: Medicare Other | Admitting: Physical Therapy

## 2021-10-04 DIAGNOSIS — M4125 Other idiopathic scoliosis, thoracolumbar region: Secondary | ICD-10-CM

## 2021-10-04 DIAGNOSIS — M5432 Sciatica, left side: Secondary | ICD-10-CM | POA: Diagnosis not present

## 2021-10-04 DIAGNOSIS — M62838 Other muscle spasm: Secondary | ICD-10-CM

## 2021-10-04 DIAGNOSIS — R2689 Other abnormalities of gait and mobility: Secondary | ICD-10-CM

## 2021-10-04 NOTE — Therapy (Signed)
[place] MAIN [facility] SERVICES [address] [place], [place], [address] Phone: [phone]   Fax:  [phone]  Physical Therapy Treatment  Patient Details  Name: Randall SOLOWAY, Randall Shannon MRN: [id number] Date of Birth: 06/12/1956 Referring Provider (PT): [name]   Encounter Date: 10/04/2021   PT End of Session - 10/04/21 1521     Visit Number 4    Number of Visits 10    Date for PT Re-Evaluation 11/09/21    PT Start Time 1300    PT Stop Time 1400    PT Time Calculation (min) 60 min    Activity Tolerance Patient tolerated treatment well    Behavior During Therapy [facility] for tasks assessed/performed             Past Medical History:  Diagnosis Date   Atypical atrial flutter ([place])    Cervical spondylosis with myelopathy and radiculopathy    Enlarged prostate    Restless leg syndrome    Sleep-disordered breathing     Past Surgical History:  Procedure Laterality Date   ANTERIOR CERVICAL DECOMP/DISCECTOMY FUSION N/A 05/13/2018   Procedure: ANTERIOR CERVICAL DECOMPRESSION/DISCECTOMY FUSION , INTERBODY PROSTHESIS, PLATE/SCREWS CERVICAL FOUR- CERVICAL FIVE, CERVICAL FIVE- CERVICAL SIX, CERVICAL SIX- CERVICAL SEVEN;  Surgeon: Newman Pies, Randall Shannon;  Location: [place];  Service: Neurosurgery;  Laterality: N/A;   APPENDECTOMY     BACK SURGERY  2009   ELECTROPHYSIOLOGIC STUDY N/A 04/07/2016   Procedure: A-Flutter Ablation;  Surgeon: Deboraha Sprang, Randall Shannon;  Location: [place] [facility];  Service: Cardiovascular;  Laterality: N/A;   HERNIA REPAIR Bilateral    2 left side, 1 on right inguinal   LUMBAR LAMINECTOMY/DECOMPRESSION MICRODISCECTOMY N/A 02/04/2014   Procedure: Lumbar Three-Four Laminectomy, Lumbar Two Laminotomy;  Surgeon: Ophelia Charter, Randall Shannon;  Location: [place] [facility];  Service: Neurosurgery;  Laterality: N/A;  Lumbar Three-Four Laminectomy, Lumbar Two Laminotomy   TONSILLECTOMY      There were no vitals filed for this visit.   Subjective  Assessment - 10/04/21 1308     Subjective Pt reported the radiating pain returned the next day after last session. Pt contineus to take Gabapentin, Ibuprofen, and Tynenol. Pt saw pain specialist in [place] and plans to get an epidural tomorrow. This afternoon, pt will get his CPAP machine.   Today, in the clinic, pt reported 5/10 down to the ankle and pt stopped taking his pain meds since 2am this morning. Pt notices flycasting causes more pain the night  afterwards  and he stopped for the past 3 days. Both legs still feel heavy and tired since this pain started. No radiating pain with walking.    Pertinent History 1 cervical surgery fusion C5-6, 2 LBP surgeries, discectomy L4-5 and spinal fusion L3-5 which provided complete relief. Pt has acute injury leading to the first lumbar surgery due to radiating down RLE and weakness. Pt had pseudo-claudication ( nerve compression bilaterally)  leading to second surgery. Meaninng tasks: flyfishing, short backpacking trip 2-3 miles w/ elevation, walking in [place] with family, playing on the floor wtih grandchildren. New Dx of HTN,                [facility] PT Assessment - 10/04/21 1318       Palpation   Spinal mobility no pain with L sideflexion, 50% decreased intensity of radicular pain in slump test with more ROM with hip flex/ DF/ trunk -thigh angle , counter stretch with forward flexion: 75% decreased intensity with more mobility  at trunk-thigh angle and hamstrings    Palpation comment no more tightness at posterior pelvic floor R, tightness at intrinsic feet mm B, limited toe extension/ abduction, hypomobile hind/mid/forefoot on B, limited superior mobility of  tib/fib joint, tightness at anterior/ lateral compartment of leg B                           [facility] Adult PT Treatment/Exercise - 10/04/21 1531       Neuro Re-ed    Neuro Re-ed Details  cued for feet massage to promote toe abduction , ankle ROM,      Manual Therapy   Manual  therapy comments distractino at talocrural B, STM/MWM at feet to promote DF/, toe abduction. ext                          PT Long Term Goals - 10/04/21 1316       PT LONG TERM GOAL #1   Title Pt will report being able to stand for > 30 min, walking on elliptical for > 20 min 2/2 burning and radiating pain to L anterior thigh.    Time 10    Period Weeks    Status New    Target Date 11/09/21      PT LONG TERM GOAL #2   Title Pt's FOTO score will improve from 59pts to > 67 pts in order improve QOL    Time 10    Period Weeks    Status New    Target Date 11/09/21      PT LONG TERM GOAL #3   Title Pt will get screened for sleep study to rule in/out OSA to improve sleep quality for pain management and nocturia    Time 4    Period Weeks    Status New    Target Date 09/28/21      PT LONG TERM GOAL #4   Title Pt will demo more posterior rotation of L UE in gait, less T/L junction convex , levelled pelvic girdle / shoulders in order to minimize LBP and pain and return to standing and squat    Baseline L iliac crest higher, R shoulder higher    Time 6    Period Weeks    Status New    Target Date 10/13/21      PT LONG TERM GOAL #5   Title Pt will  be IND and compliant with casting practice with LUE to promote posterior rotation of thorax/ minimize LBP and continue flyfishing with less risk for injuries    Time 8    Period Weeks    Status New    Target Date 10/27/21                   Plan - 10/04/21 1522     Clinical Impression Statement Pt demonstrated good carry over from last session with decreased intensity of radiating pain in past positions with concordant sign.   Addressed lower kinetic chain tightness at feet and leg to promote DF/toe abduction to improve push off. Encouraged 6 min walking at home for aerobic activity.  Pt demo'd improved feet/ ankle mobility post Tx.   Pt will get an epidural tomorrow and his CPAP machine this afternoon. Plan to  continue with improving mobility and centralize radicular Sx. Pt continues to benefits from skilled PT.     Comorbidities HTN, Past surgeries neck and back, BPH  Examination-Activity Limitations Locomotion Level;Sleep;Stand;Squat    Stability/Clinical Decision Making Evolving/Moderate complexity    Rehab Potential Good    PT Frequency 1x / week   10   PT Duration Other (comment)   10   PT Treatment/Interventions Therapeutic activities;Therapeutic exercise;Neuromuscular re-education;Patient/family education;Moist Heat;Cryotherapy;Manual techniques;Taping;Balance training;ADLs/Self Care Home Management;Gait training;Joint Manipulations;Scar mobilization    Consulted and Agree with Plan of Care Patient             Patient will benefit from skilled therapeutic intervention in order to improve the following deficits and impairments:  Abnormal gait, Decreased coordination, Decreased endurance, Decreased range of motion, Decreased activity tolerance, Decreased mobility, Decreased scar mobility, Decreased strength, Difficulty walking, Hypomobility, Increased muscle spasms, Impaired flexibility, Improper body mechanics, Postural dysfunction, Pain  Visit Diagnosis: Sciatica, left side  Other abnormalities of gait and mobility  Other idiopathic scoliosis, thoracolumbar region  Other muscle spasm     Problem List Patient Active Problem List   Diagnosis Date Noted   Cervical spondylosis with myelopathy and radiculopathy 05/13/2018   Fever and chills 11/16/2017   Atypical atrial flutter ([place]) 04/07/2016   Paroxysmal atrial flutter ([place]) 03/27/2016   BPH with obstruction/lower urinary tract symptoms 07/14/2015   Disc disease, degenerative, lumbar or lumbosacral 07/14/2015   RLS (restless legs syndrome) 07/14/2015   Lumbar stenosis with neurogenic claudication 02/04/2014   Spinal stenosis of lumbar region 02/04/2014    Jerl Mina, PT 10/04/2021, 4:06 PM  [place] MAIN [facility] SERVICES [address] [place], [place], [address] Phone: [phone]   Fax:  [phone]  Name: Randall MCDIARMID, Randall Shannon MRN: [id number] Date of Birth: [birth year]

## 2021-10-04 NOTE — Patient Instructions (Signed)
Feet care :  Self -feet massage with 45 deg turn to edge of couch/ bed, figure-4   Handshake : fingers between toes, moving ballmounds/toes back and forth several times while other hand anchors at arch. Do the same at the hind/mid foot.  Heel to toes upward to a letter Big Letter T strokes to spread ballmounds and toes, several times, pinch between webs of toes  Run finger tips along top of foot between long bones "comb between the bones"    Wiggle toes and spread them out when relaxing    ___   Heel raises with toe spread  ( no clawing)  10 reps with hand on wall then hip flexor/ calf stretch after that  __  Deep core level 1-2

## 2021-10-11 ENCOUNTER — Ambulatory Visit: Payer: Medicare Other | Admitting: Physical Therapy

## 2021-10-11 ENCOUNTER — Other Ambulatory Visit: Payer: Self-pay

## 2021-10-11 DIAGNOSIS — R2689 Other abnormalities of gait and mobility: Secondary | ICD-10-CM

## 2021-10-11 DIAGNOSIS — M5432 Sciatica, left side: Secondary | ICD-10-CM

## 2021-10-11 DIAGNOSIS — M62838 Other muscle spasm: Secondary | ICD-10-CM

## 2021-10-11 DIAGNOSIS — M4125 Other idiopathic scoliosis, thoracolumbar region: Secondary | ICD-10-CM

## 2021-10-11 NOTE — Therapy (Addendum)
[place] MAIN [facility] SERVICES [address] [place], [place], [address] Phone: [phone]   Fax:  [phone]  Physical Therapy Treatment  Patient Details  Name: Randall HEMMER, Randall Shannon MRN: [id number] Date of Birth: [birth year] Referring Provider (PT): [name]   Encounter Date: 10/11/2021   PT End of Session - 10/11/21 1705     Visit Number 5    Number of Visits 10    Date for PT Re-Evaluation 11/09/21    PT Start Time 1600    PT Stop Time 1700    PT Time Calculation (min) 60 min    Activity Tolerance Patient tolerated treatment well    Behavior During Therapy [facility] for tasks assessed/performed             Past Medical History:  Diagnosis Date   Atypical atrial flutter ([place])    Cervical spondylosis with myelopathy and radiculopathy    Enlarged prostate    Restless leg syndrome    Sleep-disordered breathing     Past Surgical History:  Procedure Laterality Date   ANTERIOR CERVICAL DECOMP/DISCECTOMY FUSION N/A 05/13/2018   Procedure: ANTERIOR CERVICAL DECOMPRESSION/DISCECTOMY FUSION , INTERBODY PROSTHESIS, PLATE/SCREWS CERVICAL FOUR- CERVICAL FIVE, CERVICAL FIVE- CERVICAL SIX, CERVICAL SIX- CERVICAL SEVEN;  Surgeon: Newman Pies, Randall Shannon;  Location: [place];  Service: Neurosurgery;  Laterality: N/A;   APPENDECTOMY     BACK SURGERY  2009   ELECTROPHYSIOLOGIC STUDY N/A 04/07/2016   Procedure: A-Flutter Ablation;  Surgeon: Deboraha Sprang, Randall Shannon;  Location: [place] CV LAB;  Service: Cardiovascular;  Laterality: N/A;   HERNIA REPAIR Bilateral    2 left side, 1 on right inguinal   LUMBAR LAMINECTOMY/DECOMPRESSION MICRODISCECTOMY N/A 02/04/2014   Procedure: Lumbar Three-Four Laminectomy, Lumbar Two Laminotomy;  Surgeon: Ophelia Charter, Randall Shannon;  Location: [place] [facility];  Service: Neurosurgery;  Laterality: N/A;  Lumbar Three-Four Laminectomy, Lumbar Two Laminotomy   TONSILLECTOMY      There were no vitals filed for this visit.   Subjective  Assessment - 10/11/21 1607     Subjective Pt reported the epidural shot which has helped with the pain alot. Pt does not feel the pain at night.    Pertinent History 1 cervical surgery fusion C5-6, 2 LBP surgeries, discectomy L4-5 and spinal fusion L3-5 which provided complete relief. Pt has acute injury leading to the first lumbar surgery due to radiating down RLE and weakness. Pt had pseudo-claudication ( nerve compression bilaterally)  leading to second surgery. Meaninng tasks: flyfishing, short backpacking trip 2-3 miles w/ elevation, walking in [place] with family, playing on the floor wtih grandchildren. New Dx of HTN,                [facility] PT Assessment - 10/11/21 1620       Observation/Other Assessments   Observations counter stretch trunk to thigh angle 65 deg, no radiating pain ( epidural shot last week) ,slump test with more spinal flexion and no radiating pain      AROM   Overall AROM Comments toe extension. abduction improved compared to lastsession but still limited in extension      Strength   Overall Strength Comments MMT 3+/5 L 12 reps,  PF single UE support, R LE 20 reps  4/5 ,=      Palpation   Palpation comment signficant hypomobile at midfoot joints B, L toe abduction more limited than R  [facility] Adult PT Treatment/Exercise - 10/11/21 1842       Therapeutic Activites    Other Therapeutic Activities discussed changing shoe wear and plan to diversify aerobic routine with less elliptical machine and to find a mode that will help with more ankle DF and toe ext      Neuro Re-ed    Neuro Re-ed Details  cued for toes abduction, knee and toe propioception to minmize toe flexion/ knee adduction in seated position , promoting DF/ EV, eccentric PF      Manual Therapy   Manual therapy comments PA/ AP  mob at mi d foot joints to promote DF/ EV, toe abduction B    Kinesiotex Facilitate Muscle   toe abduction, DF at tib-fib, lift  navicular at L                         PT Long Term Goals - 10/04/21 1316       PT LONG TERM GOAL #1   Title Pt will report being able to stand for > 30 min, walking on elliptical for > 20 min 2/2 burning and radiating pain to L anterior thigh.    Time 10    Period Weeks    Status New    Target Date 11/09/21      PT LONG TERM GOAL #2   Title Pt's FOTO score will improve from 59pts to > 67 pts in order improve QOL    Time 10    Period Weeks    Status New    Target Date 11/09/21      PT LONG TERM GOAL #3   Title Pt will get screened for sleep study to rule in/out OSA to improve sleep quality for pain management and nocturia    Time 4    Period Weeks    Status New    Target Date 09/28/21      PT LONG TERM GOAL #4   Title Pt will demo more posterior rotation of L UE in gait, less T/L junction convex , levelled pelvic girdle / shoulders in order to minimize LBP and pain and return to standing and squat    Baseline L iliac crest higher, R shoulder higher    Time 6    Period Weeks    Status New    Target Date 10/13/21      PT LONG TERM GOAL #5   Title Pt will  be IND and compliant with casting practice with LUE to promote posterior rotation of thorax/ minimize LBP and continue flyfishing with less risk for injuries    Time 8    Period Weeks    Status New    Target Date 10/27/21                   Plan - 10/11/21 1705     Clinical Impression Statement Pt received an epidural and it has helped with pain significantly. Pt demo'd increased trunk-thigh angle in seated and standing positions with no more radiating pain.   Continued to apply manual Tx to promote DF/ EV and toe abduction for more hip flexion, knee flexion, and push off in gait.  Discussed changing shoe wear and discussed plan to diversify aerobic routine with less elliptical machine and to find a mode that will help with more ankle DF and toe extension.   Plan to add dynamic balance  exercise in upcoming sessions to utilize more ankle mobility to help  pt maintain balance when fly fishing in a boat or standing in moving waters.        Pt's lumbar pain incident occurred while pt was in a boat on moving waters.  Spinal alignment and pelvis remain levelled as pt continues to practice fly casting in opposite direction. Anticipate pt will continue to improve with mobility and more education on appropriate stretches, and diversifying his gym routine.Pt continues to benefit from skilled PT     Comorbidities HTN, Past surgeries neck and back, BPH    Examination-Activity Limitations Locomotion Level;Sleep;Stand;Squat    Stability/Clinical Decision Making Evolving/Moderate complexity    Rehab Potential Good    PT Frequency 1x / week   10   PT Duration Other (comment)   10   PT Treatment/Interventions Therapeutic activities;Therapeutic exercise;Neuromuscular re-education;Patient/family education;Moist Heat;Cryotherapy;Manual techniques;Taping;Balance training;ADLs/Self Care Home Management;Gait training;Joint Manipulations;Scar mobilization    Consulted and Agree with Plan of Care Patient             Patient will benefit from skilled therapeutic intervention in order to improve the following deficits and impairments:  Abnormal gait, Decreased coordination, Decreased endurance, Decreased range of motion, Decreased activity tolerance, Decreased mobility, Decreased scar mobility, Decreased strength, Difficulty walking, Hypomobility, Increased muscle spasms, Impaired flexibility, Improper body mechanics, Postural dysfunction, Pain  Visit Diagnosis: Other abnormalities of gait and mobility  Sciatica, left side  Other idiopathic scoliosis, thoracolumbar region  Other muscle spasm     Problem List Patient Active Problem List   Diagnosis Date Noted   Cervical spondylosis with myelopathy and radiculopathy 05/13/2018   Fever and chills 11/16/2017   Atypical atrial flutter ([place])  04/07/2016   Paroxysmal atrial flutter ([place]) 03/27/2016   BPH with obstruction/lower urinary tract symptoms 07/14/2015   Disc disease, degenerative, lumbar or lumbosacral 07/14/2015   RLS (restless legs syndrome) 07/14/2015   Lumbar stenosis with neurogenic claudication 02/04/2014   Spinal stenosis of lumbar region 02/04/2014    Jerl Mina, PT 10/11/2021, 6:45 PM  [place] [address] [place], [place], [address] Phone: [phone]   Fax:  [phone]  Name: Randall KABAT, Randall Shannon MRN: [id number] Date of Birth: [birth year]

## 2021-10-11 NOTE — Patient Instructions (Signed)
Heel raises with toes spread with toe spreader  15 reps      Feet slides :   Points of contact at sitting bones  Four points of contact of foot, Heel up, ankle not twist out Lower heel Four points of contact of foot, Slide foot back   Repeated with other foot       Walking with higher knees, push off

## 2021-10-18 ENCOUNTER — Ambulatory Visit: Payer: Medicare Other | Admitting: Physical Therapy

## 2021-10-26 ENCOUNTER — Ambulatory Visit: Payer: Medicare Other | Attending: Internal Medicine | Admitting: Physical Therapy

## 2021-10-26 ENCOUNTER — Other Ambulatory Visit: Payer: Self-pay

## 2021-10-26 DIAGNOSIS — R2689 Other abnormalities of gait and mobility: Secondary | ICD-10-CM

## 2021-10-26 DIAGNOSIS — M4125 Other idiopathic scoliosis, thoracolumbar region: Secondary | ICD-10-CM | POA: Diagnosis present

## 2021-10-26 DIAGNOSIS — M5432 Sciatica, left side: Secondary | ICD-10-CM

## 2021-10-26 DIAGNOSIS — M62838 Other muscle spasm: Secondary | ICD-10-CM

## 2021-10-26 NOTE — Therapy (Addendum)
[place] MAIN [facility] SERVICES [address] [place], [place], [address] Phone: [phone]   Fax:  [phone]  Physical Therapy Treatment  Patient Details  Name: Randall HADDEN, MD MRN: [id number] Date of Birth: [birth year] Referring Provider (PT): Sparks MD   Encounter Date: 10/26/2021   PT End of Session - 10/26/21 1103     Visit Number 6    Number of Visits 10    Date for PT Re-Evaluation 11/09/21    PT Start Time 0900    PT Stop Time 1000    PT Time Calculation (min) 60 min    Activity Tolerance Patient tolerated treatment well    Behavior During Therapy [facility] for tasks assessed/performed             Past Medical History:  Diagnosis Date   Atypical atrial flutter ([place])    Cervical spondylosis with myelopathy and radiculopathy    Enlarged prostate    Restless leg syndrome    Sleep-disordered breathing     Past Surgical History:  Procedure Laterality Date   ANTERIOR CERVICAL DECOMP/DISCECTOMY FUSION N/A 05/13/2018   Procedure: ANTERIOR CERVICAL DECOMPRESSION/DISCECTOMY FUSION , INTERBODY PROSTHESIS, PLATE/SCREWS CERVICAL FOUR- CERVICAL FIVE, CERVICAL FIVE- CERVICAL SIX, CERVICAL SIX- CERVICAL SEVEN;  Surgeon: Newman Pies, MD;  Location: [place];  Service: Neurosurgery;  Laterality: N/A;   APPENDECTOMY     BACK SURGERY  2009   ELECTROPHYSIOLOGIC STUDY N/A 04/07/2016   Procedure: A-Flutter Ablation;  Surgeon: Deboraha Sprang, MD;  Location: [place] [facility];  Service: Cardiovascular;  Laterality: N/A;   HERNIA REPAIR Bilateral    2 left side, 1 on right inguinal   LUMBAR LAMINECTOMY/DECOMPRESSION MICRODISCECTOMY N/A 02/04/2014   Procedure: Lumbar Three-Four Laminectomy, Lumbar Two Laminotomy;  Surgeon: Ophelia Charter, MD;  Location: [place] [facility];  Service: Neurosurgery;  Laterality: N/A;  Lumbar Three-Four Laminectomy, Lumbar Two Laminotomy   TONSILLECTOMY      There were no vitals filed for this visit.   Subjective  Assessment - 10/26/21 0909     Subjective Pt reported tthe CPAP is helping him sleep for 9 straight hours. Nocturia is notan issue. Pt will be travelling to [place]  2/3 through 2/17.  Then [place]2/23 through 2/27.    Pertinent History 1 cervical surgery fusion C5-6, 2 LBP surgeries, discectomy L4-5 and spinal fusion L3-5 which provided complete relief. Pt has acute injury leading to the first lumbar surgery due to radiating down RLE and weakness. Pt had pseudo-claudication ( nerve compression bilaterally)  leading to second surgery. Meaninng tasks: flyfishing, short backpacking trip 2-3 miles w/ elevation, walking in [place] with family, playing on the floor wtih grandchildren. New Dx of HTN,                [facility] PT Assessment - 10/26/21 1056       Observation/Other Assessments   Observations don/doff socks and shoes with figure-4 position without pain      Single Leg Stance   Comments 30 sec with UE support, on balance prop with posterior COM on heel      Other:   Other/ Comments stepping back with poor propioception, narrow BOS, scissoring and LOB with UE reaching for balance,      Palpation   Palpation comment improved DF/ toe abduction                           [facility] Adult PT Treatment/Exercise - 10/26/21  1059       Therapeutic Activites    Therapeutic Activities Other Therapeutic Activities    Other Therapeutic Activities manual applied perturbation at waist in 4 direction to elicit stepping strategy 5 laps of 10 ft, explained application of AROM at toe ext, DF, knee / hip flexion , anterior COM and importance with balance when fly fishing, discussed approach to treadmill walking with less focus on resistance and moreon longer strides and ROM in kinetic chain      Neuro Re-ed    Neuro Re-ed Details  cued for more anterior COM and transverse arch c o-activation, eccentric control of PF, propioception of feet in back ward stepping                           PT Long Term Goals - 10/04/21 1316       PT LONG TERM GOAL #1   Title Pt will report being able to stand for > 30 min, walking on elliptical for > 20 min 2/2 burning and radiating pain to L anterior thigh.    Time 10    Period Weeks    Status New    Target Date 11/09/21      PT LONG TERM GOAL #2   Title Pt's FOTO score will improve from 59pts to > 67 pts in order improve QOL    Time 10    Period Weeks    Status New    Target Date 11/09/21      PT LONG TERM GOAL #3   Title Pt will get screened for sleep study to rule in/out OSA to improve sleep quality for pain management and nocturia    Time 4    Period Weeks    Status New    Target Date 09/28/21      PT LONG TERM GOAL #4   Title Pt will demo more posterior rotation of L UE in gait, less T/L junction convex , levelled pelvic girdle / shoulders in order to minimize LBP and pain and return to standing and squat    Baseline L iliac crest higher, R shoulder higher    Time 6    Period Weeks    Status New    Target Date 10/13/21      PT LONG TERM GOAL #5   Title Pt will  be IND and compliant with casting practice with LUE to promote posterior rotation of thorax/ minimize LBP and continue flyfishing with less risk for injuries    Time 8    Period Weeks    Status New    Target Date 10/27/21                   Plan - 10/26/21 1103     Clinical Impression Statement Pt showed significantly improved feet, ankle, knee, hip, spinal mobility and able to doff/don shoes/ socks in figure-4 position without compensation nor pain.   His epidural shot from a few weeks ago continues to help pt continue to make further progress with PT Tx and HEP.   Progressed to dynamic balance training on uneven surfaces for ankle strategy. Manual applied perturbation to elicit stepping strategy. Anticipate    pt will continue to improve with balance as pt improves with feet/ ankle mobility. Pt requried excessive cues (  tactile, visual, and visual) for propioception of feet and eccentric control of plantar fascia.   Explained application of AROM at toe ext, DF, knee /  hip flexion , anterior COM and importance with balance when fly fishing. Discussed approach to treadmill walking with less focus on resistance and more on longer strides and ROM in kinetic chain.  Recommended pt to tai chi and connected pt to a Adult nurse. Explained how tai chi can be helpful for balance and stability long term. Pt voiced interest to pursue tai chi in addition to PT.    Pt continues to benefit from skilled PT.      Comorbidities HTN, Past surgeries neck and back, BPH    Examination-Activity Limitations Locomotion Level;Sleep;Stand;Squat    Stability/Clinical Decision Making Evolving/Moderate complexity    Rehab Potential Good    PT Frequency 1x / week   10   PT Duration Other (comment)   10   PT Treatment/Interventions Therapeutic activities;Therapeutic exercise;Neuromuscular re-education;Patient/family education;Moist Heat;Cryotherapy;Manual techniques;Taping;Balance training;ADLs/Self Care Home Management;Gait training;Joint Manipulations;Scar mobilization    Consulted and Agree with Plan of Care Patient             Patient will benefit from skilled therapeutic intervention in order to improve the following deficits and impairments:  Abnormal gait, Decreased coordination, Decreased endurance, Decreased range of motion, Decreased activity tolerance, Decreased mobility, Decreased scar mobility, Decreased strength, Difficulty walking, Hypomobility, Increased muscle spasms, Impaired flexibility, Improper body mechanics, Postural dysfunction, Pain  Visit Diagnosis: Sciatica, left side  Other abnormalities of gait and mobility  Other idiopathic scoliosis, thoracolumbar region  Other muscle spasm     Problem List Patient Active Problem List   Diagnosis Date Noted   Cervical spondylosis with myelopathy and  radiculopathy 05/13/2018   Fever and chills 11/16/2017   Atypical atrial flutter ([place]) 04/07/2016   Paroxysmal atrial flutter ([place]) 03/27/2016   BPH with obstruction/lower urinary tract symptoms 07/14/2015   Disc disease, degenerative, lumbar or lumbosacral 07/14/2015   RLS (restless legs syndrome) 07/14/2015   Lumbar stenosis with neurogenic claudication 02/04/2014   Spinal stenosis of lumbar region 02/04/2014    Jerl Mina, PT 10/26/2021, 11:03 AM  [place], [place], [address] Phone: [phone]   Fax:  [phone]  Name: CARLON CHALOUX, MD MRN: [id number] Date of Birth: [birth year]

## 2021-10-26 NOTE — Patient Instructions (Signed)
Themes today:  Notice COM over ballmounds  Slowed eccentric control of heel   __  Standing one leg with hand on counter on balance bubble 30 sec   Step up for 5 sec on the bubble Step back slowed heel lowers, feet placement hip width apart   __  Step up on R foot, halfway on edge of step  Step up L Step down R,slowed heel down   30 reps on each  ___             Single heel raises  15reps  __  Heel raises - heels together, minisquat  Knees bent pointed out like a "v" , navel ( center of mass) more forward  Heels together as you lift, pointed out like a "v"  KNEES ARE ALIGNED BEHIND THE TOES TO MINIMIZE STRAIN ON THE KNEES Lower heels down slow with your  navel ( center of mass) more forward to a avoid dropping down fast and rocking more weight back onto heels   10 reps  ________

## 2021-10-27 ENCOUNTER — Ambulatory Visit: Payer: Medicare Other | Admitting: Physical Therapy

## 2021-11-01 ENCOUNTER — Ambulatory Visit: Payer: Medicare Other | Admitting: Physical Therapy

## 2021-11-01 ENCOUNTER — Other Ambulatory Visit: Payer: Self-pay

## 2021-11-01 DIAGNOSIS — M5432 Sciatica, left side: Secondary | ICD-10-CM | POA: Diagnosis not present

## 2021-11-01 DIAGNOSIS — R2689 Other abnormalities of gait and mobility: Secondary | ICD-10-CM

## 2021-11-01 DIAGNOSIS — M62838 Other muscle spasm: Secondary | ICD-10-CM

## 2021-11-02 NOTE — Patient Instructions (Addendum)
Standing PNF band exercises  Stand 45 deg to doorway, front leg is opp of hand holding band   "Drawing a sword" " Pulling a lawnmover" Band is under feet, hold band in L hand, across body by R pocket  Press downward into the ballmounds and heels of the feet, thigh muscle active, Keep knees and hips squared the front and do not move them throughout the activity, Exhale to pull band from R pocket,  across the face to the R pocket, rotating only your ribcage to L as if "drawing a sword"    10 x 2 reps      "Pulling seat belt"   Band over door knob with knot on the other side of the door Stand with toes, knees, and pelvis turned 45 deg away from the door,  Exhale to pull band from R ear height across body to the L pocket without turning pelvis and knees  Follow hand with eyes/head  10 x 2 reps     ---  Handibands: mini squat with arms in "w"     Handiband places closer to doorway edges so they are wide  Stand facing away from door  Grab band thumbs down so band wraps by pinky side "W"position with elbows by side, hands at shoulder height ( not moving them)  minisquat position, chin down towards heart  Inhale , arms up above head elbows bent out, at ear level Exhale, minisquat, press into the ballmounds of your feet , lower elbows, chine down towards heart    10 x 3  ___   Stand facing door and the same exercise as above

## 2021-11-02 NOTE — Therapy (Signed)
[place] MAIN [facility] SERVICES [address] [place], [place], [address] Phone: [phone]   Fax:  [phone]  Physical Therapy Treatment  Patient Details  Name: Randall GREENLAW, MD MRN: [id number] Date of Birth: 1956-10-09 Referring Provider (PT): Sparks MD   Encounter Date: 11/01/2021   PT End of Session - 11/01/21 1412     Visit Number 7    Number of Visits 10    Date for PT Re-Evaluation 11/09/21    PT Start Time 6720    PT Stop Time 1500    PT Time Calculation (min) 57 min    Activity Tolerance Patient tolerated treatment well    Behavior During Therapy [facility] for tasks assessed/performed             Past Medical History:  Diagnosis Date   Atypical atrial flutter ([place])    Cervical spondylosis with myelopathy and radiculopathy    Enlarged prostate    Restless leg syndrome    Sleep-disordered breathing     Past Surgical History:  Procedure Laterality Date   ANTERIOR CERVICAL DECOMP/DISCECTOMY FUSION N/A 05/13/2018   Procedure: ANTERIOR CERVICAL DECOMPRESSION/DISCECTOMY FUSION , INTERBODY PROSTHESIS, PLATE/SCREWS CERVICAL FOUR- CERVICAL FIVE, CERVICAL FIVE- CERVICAL SIX, CERVICAL SIX- CERVICAL SEVEN;  Surgeon: Newman Pies, MD;  Location: [place];  Service: Neurosurgery;  Laterality: N/A;   APPENDECTOMY     BACK SURGERY  2009   ELECTROPHYSIOLOGIC STUDY N/A 04/07/2016   Procedure: A-Flutter Ablation;  Surgeon: Deboraha Sprang, MD;  Location: [place] CV LAB;  Service: Cardiovascular;  Laterality: N/A;   HERNIA REPAIR Bilateral    2 left side, 1 on right inguinal   LUMBAR LAMINECTOMY/DECOMPRESSION MICRODISCECTOMY N/A 02/04/2014   Procedure: Lumbar Three-Four Laminectomy, Lumbar Two Laminotomy;  Surgeon: Ophelia Charter, MD;  Location: [place] NEURO ORS;  Service: Neurosurgery;  Laterality: N/A;  Lumbar Three-Four Laminectomy, Lumbar Two Laminotomy   TONSILLECTOMY      There were no vitals filed for this visit.   Subjective  Assessment - 11/01/21 1412     Subjective Pt [place] the [place]. Pt sleep 8 hours uninterrupted and felt great. Pt drove back without power napping. Pt only felt fatigue/ tightness in his back after flyfishing 5 hours in the water. No more LBP    Pertinent History 1 cervical surgery fusion C5-6, 2 LBP surgeries, discectomy L4-5 and spinal fusion L3-5 which provided complete relief. Pt has acute injury leading to the first lumbar surgery due to radiating down RLE and weakness. Pt had pseudo-claudication ( nerve compression bilaterally)  leading to second surgery. Meaninng tasks: flyfishing, short backpacking trip 2-3 miles w/ elevation, walking in [place] with family, playing on the floor wtih grandchildren. New Dx of HTN,                [facility] PT Assessment - 11/02/21 0815       Observation/Other Assessments   Observations poor alignment of feet /hips in lunge position, poor diassociation of trunk and pelvis in PNF patterns D1 D2                           [facility] Adult PT Treatment/Exercise - 11/02/21 0814       Therapeutic Activites    Other Therapeutic Activities simulated lifting carry on bag to overhead bins, pulling large luggage off carousel      Neuro Re-ed    Neuro Re-ed Details  cued for LE propioception, less  lumbar lordosis, disassociation trunk/pelvic in new resistance band HEP                          PT Long Term Goals - 10/04/21 1316       PT LONG TERM GOAL #1   Title Pt will report being able to stand for > 30 min, walking on elliptical for > 20 min 2/2 burning and radiating pain to L anterior thigh.    Time 10    Period Weeks    Status New    Target Date 11/09/21      PT LONG TERM GOAL #2   Title Pt's FOTO score will improve from 59pts to > 67 pts in order improve QOL    Time 10    Period Weeks    Status New    Target Date 11/09/21      PT LONG TERM GOAL #3   Title Pt will get screened for sleep study to rule  in/out OSA to improve sleep quality for pain management and nocturia    Time 4    Period Weeks    Status New    Target Date 09/28/21      PT LONG TERM GOAL #4   Title Pt will demo more posterior rotation of L UE in gait, less T/L junction convex , levelled pelvic girdle / shoulders in order to minimize LBP and pain and return to standing and squat    Baseline L iliac crest higher, R shoulder higher    Time 6    Period Weeks    Status New    Target Date 10/13/21      PT LONG TERM GOAL #5   Title Pt will  be IND and compliant with casting practice with LUE to promote posterior rotation of thorax/ minimize LBP and continue flyfishing with less risk for injuries    Time 8    Period Weeks    Status New    Target Date 10/27/21                   Plan - 11/02/21 6948     Clinical Impression Statement Pt reported he went flyfishing and had no pain and was using his CPAP machine which helped him to sleep 8 hours uninterrupted for the first time in 40 years. Epidural shot is helping with his LBP.  Pt required cues to correct poor alignment of feet /hips in lunge position, poor diassociation of trunk and pelvis in PNF patterns D1 D2.   Educated pt to apply PNF pattern with  simulated lifting carry on bag to overhead bins, pulling large luggage off carousel to prepare for his [place] travels.   Reviewed back stretches with strap which pt was able to perform without pain. Plan to f/u with pt to see how today's session fared for him .   Pt will be travel [place] in Feb. Plan to continue with a HEP to ensure strengthening and flexibility.  Pt continues to benefit from skilled PT      Comorbidities HTN, Past surgeries neck and back, BPH    Examination-Activity Limitations Locomotion Level;Sleep;Stand;Squat    Stability/Clinical Decision Making Evolving/Moderate complexity    Clinical Decision Making Moderate    Rehab Potential Good    PT Frequency 1x / week   10   PT Duration  Other (comment)   10   PT Treatment/Interventions Therapeutic activities;Therapeutic exercise;Neuromuscular re-education;Patient/family education;Moist Heat;Cryotherapy;Manual techniques;Taping;Balance training;ADLs/Self Care  Home Management;Gait training;Joint Manipulations;Scar mobilization    Consulted and Agree with Plan of Care Patient             Patient will benefit from skilled therapeutic intervention in order to improve the following deficits and impairments:  Abnormal gait, Decreased coordination, Decreased endurance, Decreased range of motion, Decreased activity tolerance, Decreased mobility, Decreased scar mobility, Decreased strength, Difficulty walking, Hypomobility, Increased muscle spasms, Impaired flexibility, Improper body mechanics, Postural dysfunction, Pain  Visit Diagnosis: Sciatica, left side  Other abnormalities of gait and mobility  Other muscle spasm     Problem List Patient Active Problem List   Diagnosis Date Noted   Cervical spondylosis with myelopathy and radiculopathy 05/13/2018   Fever and chills 11/16/2017   Atypical atrial flutter ([place]) 04/07/2016   Paroxysmal atrial flutter ([place]) 03/27/2016   BPH with obstruction/lower urinary tract symptoms 07/14/2015   Disc disease, degenerative, lumbar or lumbosacral 07/14/2015   RLS (restless legs syndrome) 07/14/2015   Lumbar stenosis with neurogenic claudication 02/04/2014   Spinal stenosis of lumbar region 02/04/2014    Jerl Mina, PT 11/02/2021, 8:26 AM  [place], [place], [address] Phone: [phone]   Fax:  [phone]  Name: JADON HARBAUGH, MD MRN: [id number] Date of Birth: 1956-02-24

## 2021-11-07 ENCOUNTER — Other Ambulatory Visit: Payer: Self-pay

## 2021-11-07 ENCOUNTER — Ambulatory Visit: Payer: Medicare Other | Admitting: Physical Therapy

## 2021-11-07 DIAGNOSIS — R2689 Other abnormalities of gait and mobility: Secondary | ICD-10-CM

## 2021-11-07 DIAGNOSIS — M5432 Sciatica, left side: Secondary | ICD-10-CM | POA: Diagnosis not present

## 2021-11-07 DIAGNOSIS — M62838 Other muscle spasm: Secondary | ICD-10-CM

## 2021-11-07 DIAGNOSIS — M4125 Other idiopathic scoliosis, thoracolumbar region: Secondary | ICD-10-CM

## 2021-11-07 NOTE — Patient Instructions (Addendum)
Speak to Dr. Argie Ramming about your trips in 2 weeks and have a plan in case anything happens and you need medications or another epidural while travel.   ___  Stretches : Calf stretch - two types:  with wall   - toe extension against the wall while other knee is bent, pull buttocks back  5 breaths     - lunge position, back foot is down, heel and toes straight     Bent the back knee      And then add hip flexor by moving the arm on the side side as the back leg, up and down "wax on wax off " the wall   ___   Heel raises - by wall/ counter 15 reps , toe spread,  Lower slow and keep center of mass over [place] instead of lowering fast and letting more weight on heels   [place] -minisquat -heel raise    ___  Hamstring with a chair/ counter   - legs are in a scissors position , both knees slowing straightening but not quivering     Align the front knee cap with 2-3 rd toes      And equal pressure across [place] ( not all weight on the edge of foot) and spread toes  5 breaths   ___ Back stretches: chair or counter  Palms shoulder width apart  Minisquat postion Trunk is parallel to floor  A) Pull buttocks back to lengthen spine, knees bent  3 breaths   B) Bring R hand to the L, and stretch the R side trunk  3 breaths   Brings hands to center again Do the same to the L side stretch by placing L hand on top of R   D) Modified thread the needle R hand on L thigh, L  thigh pushing out slightly as the R hands pull in,  elbow bent and pulls to theR,  Look under L armpit   Do the same to other side ___    ___ Transition from standing to floor :  stand to floor transfer :      _ slow     _ mini squat      _ crawl down with one hand on thigh      _downward dog  - >  shoulders down and back-  walk the dog ( knee bents to lengthe hamstrings)      Floor to stand :   downward dog   Feet are wider than hips, crawl hands back, butt is back, knees behind toes ->  squat  Hands at waist , elbows back, chest lifts

## 2021-11-07 NOTE — Therapy (Signed)
[place] MAIN [facility] SERVICES [address] [place], [place], [address] Phone: [phone]   Fax:  [phone]  Physical Therapy Treatment  Patient Details  Name: Randall Shannon, Randall Shannon MRN: [id number] Date of Birth: 30-Jan-1956 Referring Provider (PT): [name]   Encounter Date: 11/07/2021   PT End of Session - 11/07/21 0913     Visit Number 8    Date for PT Re-Evaluation 01/16/22    PT Start Time 0903    PT Stop Time 1000    PT Time Calculation (min) 57 min    Activity Tolerance Patient tolerated treatment well    Behavior During Therapy [facility] for tasks assessed/performed             Past Medical History:  Diagnosis Date   Atypical atrial flutter ([place])    Cervical spondylosis with myelopathy and radiculopathy    Enlarged prostate    Restless leg syndrome    Sleep-disordered breathing     Past Surgical History:  Procedure Laterality Date   ANTERIOR CERVICAL DECOMP/DISCECTOMY FUSION N/A 05/13/2018   Procedure: ANTERIOR CERVICAL DECOMPRESSION/DISCECTOMY FUSION , INTERBODY PROSTHESIS, PLATE/SCREWS CERVICAL FOUR- CERVICAL FIVE, CERVICAL FIVE- CERVICAL SIX, CERVICAL SIX- CERVICAL SEVEN;  Surgeon: Newman Pies, Randall Shannon;  Location: [place];  Service: Neurosurgery;  Laterality: N/A;   APPENDECTOMY     BACK SURGERY  2009   ELECTROPHYSIOLOGIC STUDY N/A 04/07/2016   Procedure: A-Flutter Ablation;  Surgeon: Deboraha Sprang, Randall Shannon;  Location: [place] CV LAB;  Service: Cardiovascular;  Laterality: N/A;   HERNIA REPAIR Bilateral    2 left side, 1 on right inguinal   LUMBAR LAMINECTOMY/DECOMPRESSION MICRODISCECTOMY N/A 02/04/2014   Procedure: Lumbar Three-Four Laminectomy, Lumbar Two Laminotomy;  Surgeon: Ophelia Charter, Randall Shannon;  Location: [place] NEURO ORS;  Service: Neurosurgery;  Laterality: N/A;  Lumbar Three-Four Laminectomy, Lumbar Two Laminotomy   TONSILLECTOMY      There were no vitals filed for this visit.   Subjective Assessment - 11/07/21 0907      Subjective Pt reported he was able to walk on the elliptical for 45 min which he had not been able to do for 3 months. He felt great. Pt stretched afterwards. Pt has lost a total of 10 lbs in two weeks. Pt is still sleeping uninterrupted through the majority of the night. Pt gets up once a night compared to once every 1-2 hours per night.    Pertinent History 1 cervical surgery fusion C5-6, 2 LBP surgeries, discectomy L4-5 and spinal fusion L3-5 which provided complete relief. Pt has acute injury leading to the first lumbar surgery due to radiating down RLE and weakness. Pt had pseudo-claudication ( nerve compression bilaterally)  leading to second surgery. Meaninng tasks: flyfishing, short backpacking trip 2-3 miles w/ elevation, walking in [place] with family, playing on the floor wtih grandchildren. New Dx of HTN,                [facility] PT Assessment - 11/07/21 1255       Observation/Other Assessments   Observations significantly improved ankle DF and lunge position propioception required less cues      Floor to Stand   Comments able to perform plantigrade position to modified downward facing dog to lower to floor to assimulate getting items out of luggage if he placed it on the floor. This task was when his last relapse of radicular pain occured while on an [place] trip ( half kneeling position). no pain. limited hamstring mobility but increased  compared to start of [place]      Other:   Other/ Comments reviewed  heel raises and feet strengthening and mobility exericses. Pt able to perform with minor cues                           [facility] Adult PT Treatment/Exercise - 11/07/21 1257       Therapeutic Activites    Other Therapeutic Activities discussed communicating with physiatrist ( Dr. Worthy Rancher who administered his epdiural which has helped him with his LBP) and discuss with him alternative options if the epidural were to wear off while he is travelling [place]use[place] for one  [place].      Neuro Re-ed    Neuro Re-ed Details  cued for calf / hamstring/ hip flexor stretches that can be performed in airports and community spaces to optimize mobility of spine/ lower kinetic chain                          PT Long Term Goals - 11/07/21 0957       PT LONG TERM GOAL #1   Title Pt will report being able to stand for > 30 min, walking on elliptical for > 20 min 2/2 burning and radiating pain to L anterior thigh.    Time 10    Period Weeks    Status Achieved    Target Date 11/09/21      PT LONG TERM GOAL #2   Title Pt's FOTO score will improve from 59pts to > 67 pts in order improve QOL    Time 10    Period Weeks    Status On-going    Target Date 11/09/21      PT LONG TERM GOAL #3   Title Pt will get screened for sleep study to rule in/out OSA to improve sleep quality for pain management and nocturia    Time 4    Period Weeks    Status Achieved    Target Date 09/28/21      PT LONG TERM GOAL #4   Title Pt will demo more posterior rotation of L UE in gait, less T/L junction convex , levelled pelvic girdle / shoulders in order to minimize LBP and pain and return to standing and squat    Baseline L iliac crest higher, R shoulder higher    Time 6    Period Weeks    Status Achieved    Target Date 10/13/21      PT LONG TERM GOAL #5   Title Pt will  be IND and compliant with casting practice with LUE to promote posterior rotation of thorax/ minimize LBP and continue flyfishing with less risk for injuries    Time 8    Period Weeks    Status Achieved    Target Date 10/27/21      PT LONG TERM GOAL #6   Title P twill IND with lower kinetic chain stretches and demonstrate more mobility in foot in DF/EV and in hamstrings  and eccentric control of plantar fascia    Time 10    Period Weeks    Status New    Target Date 01/16/22      PT LONG TERM GOAL #7   Title Pt will demo increased reps for PF with single UE support  from 15 reps to > 20 reps in order to improve L5/S1 myotome function  and improve balance for flyfishing, stairs,    Time 10    Period Weeks    Status New    Target Date 01/16/22                   Plan - 11/07/21 0914     Clinical Impression Statement Pt has achieved 4/7 goals and progressing towards remaining goals. Pt's spinal deviations and pelvic misalignment has been corrected and he is IND with HEP and exercises to compliment fly fishing to repeated injury and excessive rotation to one side which is likely related to asymmetries of spine/ pelvis at [facility].  Epidural shot has helped pt to experience no pain as he continues to work towards increasing mobility to spine and lower kinetic chain and strengthening to deep core mm.   Today, educated and instructed t/f to floor to minimize relapse of LBP. This was the task when his last relapse of radicular pain occured while on an [place] trip ( half kneeling position).   Pt was able to perform plantigrade position to modified downward facing dog to lower to floor to assimulate getting items out of luggage if he placed it on the floor. Pt performed with no pain with this technique but showed limited hamstring mobility yet it has increased compared to start of [place]. Pt plans to  travel overseas to two countries in February. Added stretches today that could be performed in public places to maintain mobility and flexibility.   Discussed communicating with physiatrist ( Dr. Worthy Rancher who administered his epdiural which has helped him with his LBP) and discuss with him alternative options if the epidural were to wear off while he is travelling [place]use[place] for one [place] two different [place].   Pt continues to benefit from skilled PT   Comorbidities HTN, Past surgeries neck and back, BPH    Examination-Activity Limitations Locomotion Level;Sleep;Stand;Squat    Stability/Clinical Decision Making Evolving/Moderate complexity    Rehab  Potential Good    PT Frequency 1x / week   10   PT Duration Other (comment)   10   PT Treatment/Interventions Therapeutic activities;Therapeutic exercise;Neuromuscular re-education;Patient/family education;Moist Heat;Cryotherapy;Manual techniques;Taping;Balance training;ADLs/Self Care Home Management;Gait training;Joint Manipulations;Scar mobilization    Consulted and Agree with Plan of Care Patient             Patient will benefit from skilled therapeutic intervention in order to improve the following deficits and impairments:  Abnormal gait, Decreased coordination, Decreased endurance, Decreased range of motion, Decreased activity tolerance, Decreased mobility, Decreased scar mobility, Decreased strength, Difficulty walking, Hypomobility, Increased muscle spasms, Impaired flexibility, Improper body mechanics, Postural dysfunction, Pain  Visit Diagnosis: Sciatica, left side  Other abnormalities of gait and mobility  Other muscle spasm  Other idiopathic scoliosis, thoracolumbar region     Problem List Patient Active Problem List   Diagnosis Date Noted   Cervical spondylosis with myelopathy and radiculopathy 05/13/2018   Fever and chills 11/16/2017   Atypical atrial flutter ([place]) 04/07/2016   Paroxysmal atrial flutter ([place]) 03/27/2016   BPH with obstruction/lower urinary tract symptoms 07/14/2015   Disc disease, degenerative, lumbar or lumbosacral 07/14/2015   RLS (restless legs syndrome) 07/14/2015   Lumbar stenosis with neurogenic claudication 02/04/2014   Spinal stenosis of lumbar region 02/04/2014    Jerl Mina, PT 11/07/2021, 1:01 PM  [place] [address] [place], [place], [address] Phone: [phone]   Fax:  [phone]  Name: Randall Serene, Randall Shannon MRN: [id number]  Date of Birth: 07/12/1956

## 2021-11-14 ENCOUNTER — Other Ambulatory Visit: Payer: Self-pay

## 2021-11-14 ENCOUNTER — Ambulatory Visit: Payer: Medicare Other | Admitting: Physical Therapy

## 2021-11-14 DIAGNOSIS — M62838 Other muscle spasm: Secondary | ICD-10-CM

## 2021-11-14 DIAGNOSIS — M4125 Other idiopathic scoliosis, thoracolumbar region: Secondary | ICD-10-CM

## 2021-11-14 DIAGNOSIS — R2689 Other abnormalities of gait and mobility: Secondary | ICD-10-CM

## 2021-11-14 DIAGNOSIS — M5432 Sciatica, left side: Secondary | ICD-10-CM

## 2021-11-14 NOTE — Therapy (Addendum)
[place] [facility] SERVICES [address] [place], [place], [address] Phone: [phone]   Fax:  [phone]  Physical Therapy Treatment  Patient Details  Name: Randall MISNER, Randall Shannon MRN: [id number] Date of Birth: December 11, 1955 Referring Provider (PT): [name]   Encounter Date: 11/14/2021   PT End of Session - 11/14/21 0909     Visit Number 9    Date for PT Re-Evaluation 01/16/22    PT Start Time 0902    PT Stop Time 1000    PT Time Calculation (min) 58 min    Activity Tolerance Patient tolerated treatment well    Behavior During Therapy [facility] for tasks assessed/performed             Past Medical History:  Diagnosis Date   Atypical atrial flutter ([place])    Cervical spondylosis with myelopathy and radiculopathy    Enlarged prostate    Restless leg syndrome    Sleep-disordered breathing     Past Surgical History:  Procedure Laterality Date   ANTERIOR CERVICAL DECOMP/DISCECTOMY FUSION N/A 05/13/2018   Procedure: ANTERIOR CERVICAL DECOMPRESSION/DISCECTOMY FUSION , INTERBODY PROSTHESIS, PLATE/SCREWS CERVICAL FOUR- CERVICAL FIVE, CERVICAL FIVE- CERVICAL SIX, CERVICAL SIX- CERVICAL SEVEN;  Surgeon: Newman Pies, Randall Shannon;  Location: [place];  Service: Neurosurgery;  Laterality: N/A;   APPENDECTOMY     BACK SURGERY  2009   ELECTROPHYSIOLOGIC STUDY N/A 04/07/2016   Procedure: A-Flutter Ablation;  Surgeon: Deboraha Sprang, Randall Shannon;  Location: [place] CV LAB;  Service: Cardiovascular;  Laterality: N/A;   HERNIA REPAIR Bilateral    2 left side, 1 on right inguinal   LUMBAR LAMINECTOMY/DECOMPRESSION MICRODISCECTOMY N/A 02/04/2014   Procedure: Lumbar Three-Four Laminectomy, Lumbar Two Laminotomy;  Surgeon: Ophelia Charter, Randall Shannon;  Location: [place] NEURO ORS;  Service: Neurosurgery;  Laterality: N/A;  Lumbar Three-Four Laminectomy, Lumbar Two Laminotomy   TONSILLECTOMY      There were no vitals filed for this visit.   Subjective Assessment - 11/14/21 0956      Subjective Pt feels the best he has felt in over 3 months since his injury. Pt still is sleeping well with CPAP.  "I feel like a brand new person".  Pt notices he is standing on the ballmounds of his feet when on the elliptical. Pt notices his L foot is weaker on heel raises.    Pertinent History 1 cervical surgery fusion C5-6, 2 LBP surgeries, discectomy L4-5 and spinal fusion L3-5 which provided complete relief. Pt has acute injury leading to the first lumbar surgery due to radiating down RLE and weakness. Pt had pseudo-claudication ( nerve compression bilaterally)  leading to second surgery. Meaninng tasks: flyfishing, short backpacking trip 2-3 miles w/ elevation, walking in [place] with family, playing on the floor wtih grandchildren. New Dx of HTN,                [facility] PT Assessment - 11/14/21 0936       Coordination   Coordination and Movement Description poor eccentric control of PF on L      Other:   Other/ Comments half kneeling: poor alignment of knee over ankles, back ankle/ foot      Posture/Postural Control   Posture Comments navicular drop on L and toe gripping on B feet in standing      Strength   Overall Strength Comments PF with UE support on wall, L 20 reps MMT 4/5 , R 20 reps      Palpation   Palpation  comment tightness at digiti  minimi, posterior arch, limted in between ray III-IV L                           [facility] Adult PT Treatment/Exercise - 11/14/21 0936       Therapeutic Activites    Other Therapeutic Activities explained co-activationo f transverse arch, toe abduction/ less toe gripping and impact on propulsion and balance on uneven surfaces as applied to flyfishing, propulsion with hills/ stairs , alignment of knees and feet      Neuro Re-ed    Neuro Re-ed Details  cued for ER of knees and tibia, lifted arch and more transverse arch aacitivation inctead of toe gripping, step down with further stance to promote length of achilles  tendon, eccentric control of plantar arches      Manual Therapy   Manual therapy comments PA/ AP  mob at mi d foot joints to promote DF/ EV and superior mob of fib on L to promote stronger PF , heel raises                          PT Long Term Goals - 11/14/21 0948       PT LONG TERM GOAL #1   Title Pt will report being able to stand for > 30 min, walking on elliptical for > 20 min 2/2 burning and radiating pain to L anterior thigh.    Time 10    Period Weeks    Status Achieved    Target Date 11/09/21      PT LONG TERM GOAL #2   Title Pt's FOTO score will improve from 59pts to > 67 pts in order improve QOL  ( 11/14/21: 80 pts)    Time 10    Period Weeks    Status Achieved    Target Date 11/09/21      PT LONG TERM GOAL #3   Title Pt will get screened for sleep study to rule in/out OSA to improve sleep quality for pain management and nocturia    Time 4    Period Weeks    Status Achieved    Target Date 09/28/21      PT LONG TERM GOAL #4   Title Pt will demo more posterior rotation of L UE in gait, less T/L junction convex , levelled pelvic girdle / shoulders in order to minimize LBP and pain and return to standing and squat    Baseline L iliac crest higher, R shoulder higher    Time 6    Period Weeks    Status Achieved    Target Date 10/13/21      PT LONG TERM GOAL #5   Title Pt will  be IND and compliant with casting practice with LUE to promote posterior rotation of thorax/ minimize LBP and continue flyfishing with less risk for injuries    Time 8    Period Weeks    Status Achieved    Target Date 10/27/21      PT LONG TERM GOAL #6   Title P twill IND with lower kinetic chain stretches and demonstrate more mobility in foot in DF/EV and in hamstrings  and eccentric control of plantar fascia    Time 10    Period Weeks    Status On-going    Target Date 01/16/22      PT LONG TERM GOAL #7   Title Pt will  demo increased reps for PF with single UE support  from 15 reps to > 20 reps in order to improve L5/S1 myotome function and improve balance for flyfishing, stairs    Time 10    Period Weeks    Status On-going    Target Date 01/16/22                   Plan - 11/14/21 0910     Clinical Impression Statement Pt showed decreased tightness along lower kinetic chain but pt still needed manual Tx to promote more hind / mid mobilty at L foot. Following Tx, pt demo'd increased height in heel raises and increased reps.  Advanced pt to increase hip ext in step up/ downs with eccentric control of plantar fascia. Pt had Hx of plantar fascia in the past. Pt required cues for more transverse arch co-activation for better knee alignment and less toe gripping to minimize navicular drop on L foot. Plan to address half kneeling in stand<> floot t/f at next session as pt showed poor alignment. Pt continues to benefit from skilled PT.   Pt voiced he will still try to get in touch with Dr. Jeanella Cara who gave him the epidural injection for his back. Pt is going out of the country for a month to to two different countries in February. Therapist advised pt to communicate with Dr. Argie Ramming on ways to prepare if the epidural may wear off.  Pt continues to benefit from skilled PT   Comorbidities HTN, Past surgeries neck and back, BPH    Examination-Activity Limitations Locomotion Level;Sleep;Stand;Squat    Stability/Clinical Decision Making Evolving/Moderate complexity    Rehab Potential Good    PT Frequency 1x / week   10   PT Duration Other (comment)   10   PT Treatment/Interventions Therapeutic activities;Therapeutic exercise;Neuromuscular re-education;Patient/family education;Moist Heat;Cryotherapy;Manual techniques;Taping;Balance training;ADLs/Self Care Home Management;Gait training;Joint Manipulations;Scar mobilization    Consulted and Agree with Plan of Care Patient             Patient will benefit from skilled therapeutic intervention in order to  improve the following deficits and impairments:  Abnormal gait, Decreased coordination, Decreased endurance, Decreased range of motion, Decreased activity tolerance, Decreased mobility, Decreased scar mobility, Decreased strength, Difficulty walking, Hypomobility, Increased muscle spasms, Impaired flexibility, Improper body mechanics, Postural dysfunction, Pain  Visit Diagnosis: Sciatica, left side  Other abnormalities of gait and mobility  Other muscle spasm  Other idiopathic scoliosis, thoracolumbar region     Problem List Patient Active Problem List   Diagnosis Date Noted   Cervical spondylosis with myelopathy and radiculopathy 05/13/2018   Fever and chills 11/16/2017   Atypical atrial flutter ([place]) 04/07/2016   Paroxysmal atrial flutter ([place]) 03/27/2016   BPH with obstruction/lower urinary tract symptoms 07/14/2015   Disc disease, degenerative, lumbar or lumbosacral 07/14/2015   RLS (restless legs syndrome) 07/14/2015   Lumbar stenosis with neurogenic claudication 02/04/2014   Spinal stenosis of lumbar region 02/04/2014    Jerl Mina, PT 11/14/2021, 12:25 PM  [place] [address] [place], [place], [address] Phone: [phone]   Fax:  [phone]  Name: Randall ROUSSEL, Randall Shannon MRN: [id number] Date of Birth: Oct 27, 1955

## 2021-11-14 NOTE — Patient Instructions (Addendum)
Standing with no toe gripping,  Use more transverse arch,  Toes spread , knee cap out along 2-3rd toe line,   Think of horizontal rudder and the rudder ( foot planes work) for balance   ___  Heel raises with ballmounds spread

## 2021-11-21 ENCOUNTER — Ambulatory Visit: Payer: Medicare Other | Admitting: Physical Therapy

## 2021-12-26 ENCOUNTER — Other Ambulatory Visit: Payer: Self-pay

## 2021-12-26 ENCOUNTER — Ambulatory Visit: Payer: Medicare Other | Attending: Internal Medicine | Admitting: Physical Therapy

## 2021-12-26 DIAGNOSIS — R2689 Other abnormalities of gait and mobility: Secondary | ICD-10-CM | POA: Diagnosis present

## 2021-12-26 DIAGNOSIS — M5432 Sciatica, left side: Secondary | ICD-10-CM | POA: Insufficient documentation

## 2021-12-26 DIAGNOSIS — M62838 Other muscle spasm: Secondary | ICD-10-CM | POA: Insufficient documentation

## 2021-12-26 DIAGNOSIS — M4125 Other idiopathic scoliosis, thoracolumbar region: Secondary | ICD-10-CM | POA: Diagnosis present

## 2021-12-26 NOTE — Therapy (Signed)
/ [place] [address] [place], [place], [address] Phone: [phone]   Fax:  [phone]  Physical Therapy Treatment Progress N ote 08/31/21 to 12/26/21 across 10 visits  Patient Details  Name: Randall SEIDEN, Randall Shannon MRN: [id number] Date of Birth: May 05, 1956 Referring Provider (PT): [name]   Encounter Date: 12/26/2021   PT End of Session - 12/26/21 0912     Visit Number 10    Date for PT Re-Evaluation 03/06/22   eval 08/2021   PT Start Time 0902    PT Stop Time 1000    PT Time Calculation (min) 58 min    Activity Tolerance Patient tolerated treatment well    Behavior During Therapy [facility] for tasks assessed/performed             Past Medical History:  Diagnosis Date   Atypical atrial flutter ([place])    Cervical spondylosis with myelopathy and radiculopathy    Enlarged prostate    Restless leg syndrome    Sleep-disordered breathing     Past Surgical History:  Procedure Laterality Date   ANTERIOR CERVICAL DECOMP/DISCECTOMY FUSION N/A 05/13/2018   Procedure: ANTERIOR CERVICAL DECOMPRESSION/DISCECTOMY FUSION , INTERBODY PROSTHESIS, PLATE/SCREWS CERVICAL FOUR- CERVICAL FIVE, CERVICAL FIVE- CERVICAL SIX, CERVICAL SIX- CERVICAL SEVEN;  Surgeon: Newman Pies, Randall Shannon;  Location: [place];  Service: Neurosurgery;  Laterality: N/A;   APPENDECTOMY     BACK SURGERY  2009   ELECTROPHYSIOLOGIC STUDY N/A 04/07/2016   Procedure: A-Flutter Ablation;  Surgeon: Deboraha Sprang, Randall Shannon;  Location: [place] CV LAB;  Service: Cardiovascular;  Laterality: N/A;   HERNIA REPAIR Bilateral    2 left side, 1 on right inguinal   LUMBAR LAMINECTOMY/DECOMPRESSION MICRODISCECTOMY N/A 02/04/2014   Procedure: Lumbar Three-Four Laminectomy, Lumbar Two Laminotomy;  Surgeon: Ophelia Charter, Randall Shannon;  Location: [place] NEURO ORS;  Service: Neurosurgery;  Laterality: N/A;  Lumbar Three-Four Laminectomy, Lumbar Two Laminotomy   TONSILLECTOMY      There were no  vitals filed for this visit.   Subjective Assessment - 12/26/21 0909     Subjective Pt rpeorted he did well on his trip to [place]. Pt flew 7-9 hour long flight multiple times and drove on poor roads with lots of bouncing. Pt has been feeling good since he returned from his latest trip from [place].    Pertinent History 1 cervical surgery fusion C5-6, 2 LBP surgeries, discectomy L4-5 and spinal fusion L3-5 which provided complete relief. Pt has acute injury leading to the first lumbar surgery due to radiating down RLE and weakness. Pt had pseudo-claudication ( nerve compression bilaterally)  leading to second surgery. Meaninng tasks: flyfishing, short backpacking trip 2-3 miles w/ elevation, walking in [place] with family, playing on the floor wtih grandchildren. New Dx of HTN,                [facility] PT Assessment - 12/26/21 0933       Other:   Other/ Comments squat withpoor alignment at knees, simulated dumbbell with tricep/ bicep , demo'd poor scapular stabilization      Strength   Overall Strength Comments L hip abd 3+/5 , R hip 4/5    hip ext: L 3+/5, R 2/5 ( post Tx: 3+/5), R hip ext limited in AROM,   L knee flex / ext 4-/5  L LE which was 5/5      Flexibility   Hamstrings L 150deg, R 140 deg  hip  -trunk 90 deg  [place] Adult PT Treatment/Exercise - 12/26/21 1001       Therapeutic Activites    Other Therapeutic Activities reassessed global mm to guide fitness strengthening exericses ., explained the difference between resistance band strengthening versus isotonic with dumbbells and advantage of resistance band strengthening, explained the importance of sequential activation of scapular retraction strengthening and cervical retraction strengthening prior to UE bicep/tricep movements      Neuro Re-ed    Neuro Re-ed Details  cued for alignment and propioception of lower kinetic chain B, less genu valgus, more toe ext                           PT Long Term Goals - 12/26/21 1637       PT LONG TERM GOAL #1   Title Pt will report being able to stand for > 30 min, walking on elliptical for > 20 min 2/2 burning and radiating pain to L anterior thigh.    Time 10    Period Weeks    Status Achieved    Target Date 11/09/21      PT LONG TERM GOAL #2   Title Pt's FOTO score will improve from 59pts to > 67 pts in order improve QOL  ( 11/14/21: 80 pts)    Time 10    Period Weeks    Status Achieved    Target Date 11/09/21      PT LONG TERM GOAL #3   Title Pt will get screened for sleep study to rule in/out OSA to improve sleep quality for pain management and nocturia    Time 4    Period Weeks    Status Achieved    Target Date 09/28/21      PT LONG TERM GOAL #4   Title Pt will demo more posterior rotation of L UE in gait, less T/L junction convex , levelled pelvic girdle / shoulders in order to minimize LBP and pain and return to standing and squat    Baseline L iliac crest higher, R shoulder higher    Time 6    Period Weeks    Status Achieved    Target Date 10/13/21      PT LONG TERM GOAL #5   Title Pt will  be IND and compliant with casting practice with LUE to promote posterior rotation of thorax/ minimize LBP and continue flyfishing with less risk for injuries    Time 8    Period Weeks    Status Achieved    Target Date 10/27/21      Additional Long Term Goals   Additional Long Term Goals Yes      PT LONG TERM GOAL #6   Title P twill IND with lower kinetic chain stretches and demonstrate more mobility in foot in DF/EV and in hamstrings  and eccentric control of plantar fascia    Time 10    Period Weeks    Status On-going    Target Date 01/16/22      PT LONG TERM GOAL #7   Title Pt will demo increased reps for PF with single UE support from 15 reps to > 20 reps in order to improve L5/S1 myotome function and improve balance for flyfishing, stairs    Time 10    Period Weeks    Status  On-going    Target Date 01/16/22      PT LONG TERM GOAL #8   Title Pt will demo less knee  IR /adduction in lunge position in  fitness exericses to minimzie overtightness of IT band/ quad and more scapular/cervical stabilization with resistance band exericses to minmize relapse of injuries in order to return to strength training in a well rounded way    Time 10    Period Weeks    Status New    Target Date 03/06/22                   Plan - 12/26/21 0918     Clinical Impression Statement Pt has achieved 5/8 goals and progressing well towards remaining goals. Pt 's injection and compliance to HEP have helped pt to experience no more sciatic pain. Pt travelled to [place] and [place]  and also returned to walking on the elliptical with no issues. Pt demo no more deviations to spine and pelvic alignment and has gained significant flexibility of hips and hamstrings and feet. Pt 's deep core strength is improvement. Pt is advancing to resistance strengthening and was advised to use this form of strengthening to gain better antagonist mm and eccentric control versus using dumbbells.  Explained the difference between resistance band strengthening versus isotonic with dumbbells and advantage of resistance band strengthening. Explained the importance of sequential activation of scapular retraction strengthening and cervical retraction strengthening prior to UE bicep/tricep movements. Pt required cues for demo better coordination based on these principles. Plan to further progress pt with fitness exercises and providing alternative and modifications to minimize relapse of Sx and minimize injuries. Pt continues to  benefit from skilled PT.  Pt also followed advise to get a sleep study test which came back positive for OSA. Pt has been compliance with CPAP use and reports sleeping more continuously which is contributing to long-term pain management and overall wellness.    Comorbidities HTN, Past  surgeries neck and back, BPH    Examination-Activity Limitations Locomotion Level;Sleep;Stand;Squat    Stability/Clinical Decision Making Evolving/Moderate complexity    Rehab Potential Good    PT Frequency 1x / week   10   PT Duration Other (comment)   10   PT Treatment/Interventions Therapeutic activities;Therapeutic exercise;Neuromuscular re-education;Patient/family education;Moist Heat;Cryotherapy;Manual techniques;Taping;Balance training;ADLs/Self Care Home Management;Gait training;Joint Manipulations;Scar mobilization    Consulted and Agree with Plan of Care Patient             Patient will benefit from skilled therapeutic intervention in order to improve the following deficits and impairments:  Abnormal gait, Decreased coordination, Decreased endurance, Decreased range of motion, Decreased activity tolerance, Decreased mobility, Decreased scar mobility, Decreased strength, Difficulty walking, Hypomobility, Increased muscle spasms, Impaired flexibility, Improper body mechanics, Postural dysfunction, Pain  Visit Diagnosis: Sciatica, left side  Other abnormalities of gait and mobility  Other muscle spasm  Other idiopathic scoliosis, thoracolumbar region     Problem List Patient Active Problem List   Diagnosis Date Noted   Cervical spondylosis with myelopathy and radiculopathy 05/13/2018   Fever and chills 11/16/2017   Atypical atrial flutter ([place]) 04/07/2016   Paroxysmal atrial flutter ([place]) 03/27/2016   BPH with obstruction/lower urinary tract symptoms 07/14/2015   Disc disease, degenerative, lumbar or lumbosacral 07/14/2015   RLS (restless legs syndrome) 07/14/2015   Lumbar stenosis with neurogenic claudication 02/04/2014   Spinal stenosis of lumbar region 02/04/2014    Jerl Mina, PT 12/26/2021, 4:39 PM  [place] MA[facility] SERVICES [address] [place], [place], [address] Phone: [phone]   Fax:   [phone]  Name: Randall Serene, Randall Shannon  MRN: [id number] Date of Birth: 1956-06-22

## 2021-12-26 NOTE — Patient Instructions (Signed)
Blue band under back foot for bicep/ tricept dips in lunge position, shoulders  down and back ,chin tuck ?Exhale to pull 10 reps  ? ?__ ? ? ?Minisquat: WITH 10 lb dumbbell  ?Scoot buttocks back slight, hinge like you are looking at your reflection on a pond  ?Knees behind toes,  ?Inhale to "smell flowers" ? ?Exhale on the rise "like rocket"  ?Do not lock knees, have more weight across ballmounds of feet, toes relaxed  ? ?10 reps x 3 x day  ?  ?____ ? ?HIIIT:  ?Cross country skiing : ? ?Backward lunge, opp arm in V" shoulders  down and back ,chin tuck ?Placing foot back hip width apart ?Pay attention to front knee, slightly out 2-3rd toe line, back foot and heel straight  ? 2 min ? ?2 mi of stretches  ? ?One more set  ?___ ? ?Bring the clams on L hip ( R sidelying), moveing L knee  ?15-20 reps stop at burning sensation  ? ?Followed with figure-4  ? ?

## 2022-01-02 ENCOUNTER — Ambulatory Visit: Payer: Medicare Other | Admitting: Physical Therapy

## 2022-01-09 ENCOUNTER — Ambulatory Visit: Payer: Medicare Other | Admitting: Physical Therapy

## 2022-01-16 ENCOUNTER — Ambulatory Visit: Payer: Medicare Other | Admitting: Physical Therapy

## 2022-02-08 ENCOUNTER — Ambulatory Visit: Payer: Medicare Other | Attending: Internal Medicine | Admitting: Physical Therapy

## 2022-02-08 DIAGNOSIS — M4125 Other idiopathic scoliosis, thoracolumbar region: Secondary | ICD-10-CM | POA: Insufficient documentation

## 2022-02-08 DIAGNOSIS — R2689 Other abnormalities of gait and mobility: Secondary | ICD-10-CM | POA: Diagnosis present

## 2022-02-08 DIAGNOSIS — M5432 Sciatica, left side: Secondary | ICD-10-CM | POA: Diagnosis present

## 2022-02-08 DIAGNOSIS — M62838 Other muscle spasm: Secondary | ICD-10-CM | POA: Insufficient documentation

## 2022-02-09 NOTE — Therapy (Signed)
[facility] ?[facility] ?[place], [place], [address] ?Phone: [phone]   Fax:  [phone] ? ?Physical Therapy Treatment / Discharge Summary across 11 visits ? ?Patient Details  ?Name: Randall Serene, MD ?MRN: [id number] ?Date of Birth: 1956-05-06 ?Referring Provider (PT): Sparks MD ? ? ?Encounter Date: 02/08/2022 ? ? PT End of Session - 02/09/22 1050   ? ? Visit Number 11   ? Date for PT Re-Evaluation 03/06/22   ? PT Start Time 0800   ? PT Stop Time 0900   ? PT Time Calculation (min) 60 min   ? Activity Tolerance Patient tolerated treatment well   ? ?  ?  ? ?  ? ? ?Past Medical History:  ?Diagnosis Date  ? Atypical atrial flutter ([place])   ? Cervical spondylosis with myelopathy and radiculopathy   ? Enlarged prostate   ? Restless leg syndrome   ? Sleep-disordered breathing   ? ? ?Past Surgical History:  ?Procedure Laterality Date  ? ANTERIOR CERVICAL DECOMP/DISCECTOMY FUSION N/A 05/13/2018  ? Procedure: ANTERIOR CERVICAL DECOMPRESSION/DISCECTOMY FUSION , INTERBODY PROSTHESIS, PLATE/SCREWS CERVICAL FOUR- CERVICAL FIVE, CERVICAL FIVE- CERVICAL SIX, CERVICAL SIX- CERVICAL SEVEN;  Surgeon: Newman Pies, MD;  Location: [facility];  Service: Neurosurgery;  Laterality: N/A;  ? APPENDECTOMY    ? BACK SURGERY  2009  ? ELECTROPHYSIOLOGIC STUDY N/A 04/07/2016  ? Procedure: A-Flutter Ablation;  Surgeon: Deboraha Sprang, MD;  Location: [facility];  Service: Cardiovascular;  Laterality: N/A;  ? HERNIA REPAIR Bilateral   ? 2 left side, 1 on right inguinal  ? LUMBAR LAMINECTOMY/DECOMPRESSION MICRODISCECTOMY N/A 02/04/2014  ? Procedure: Lumbar Three-Four Laminectomy, Lumbar Two Laminotomy;  Surgeon: Ophelia Charter, MD;  Location: [facility];  Service: Neurosurgery;  Laterality: N/A;  Lumbar Three-Four Laminectomy, Lumbar Two Laminotomy  ? TONSILLECTOMY    ? ? ?There were no vitals filed for this visit. ? ? Subjective Assessment - 02/09/22 1048   ? ? Subjective Pt had no  LBP these past week while he was not able to do HEP due to move to another city and personal life changes. Pt went fly fishing one weekend by himself and he had a fall onto a log which prevented him from further injury. Pt's wading boots got caught and he was not able to bend.  Pt had no LBP from the fall, only soreness with his wrists which helped to catch him while he was in the river.   ? Pertinent History 1 cervical surgery fusion C5-6, 2 LBP surgeries, discectomy L4-5 and spinal fusion L3-5 which provided complete relief. Pt has acute injury leading to the first lumbar surgery due to radiating down RLE and weakness. Pt had pseudo-claudication ( nerve compression bilaterally)  leading to second surgery. Meaninng tasks: flyfishing, short backpacking trip 2-3 miles w/ elevation, walking in [place] with family, playing on the floor wtih grandchildren. New Dx of HTN,   ? ?  ?  ? ?  ? ? ? ? ? [facility] PT Assessment - 02/09/22 1050   ? ?  ? Palpation  ? Palpation comment tightness. hypombile B midfoot mobility   ?  ? Ambulation/Gait  ? Gait Comments stairs: narrow BOS, poor propulsion of back foot in ascending, posterior COM and poor eccentric control with descend   ? ?  ?  ? ?  ? ? ? ? ? ? ? ? ? ? ? ? ? ? ? ? [facility] PT Treatment/Exercise -  02/09/22 1050   ? ?  ? Therapeutic Activites   ? Other Therapeutic Activities explained principles of mobility at each level of body to maintain to minimzie relapse and encouraged HEP compliances, complimientary HEP for flyfishing to minimzie asymmetries to spine and pelvis   ?  ? Neuro Re-ed   ? Neuro Re-ed Details  cued for more propulsion of back foot in stair ascension, and wider BOS in descend with better eccentric control   ?  ? Manual Therapy  ? Manual therapy comments PA/ AP  mob at mi d foot joints to promote DF/ EV and superior mob of fib on L to promote stronger PF , heel raises   ? ?  ?  ? ?  ? ? ? ? ? ? ? ? ? ? ? ? ? ? ? PT Long Term Goals - 02/08/22 0854   ? ?  ? PT LONG  TERM GOAL #1  ? Title Pt will report being able to stand for > 30 min, walking on elliptical for > 20 min 2/2 burning and radiating pain to L anterior thigh.   ? Time 10   ? Period Weeks   ? Status Achieved   ? Target Date 11/09/21   ?  ? PT LONG TERM GOAL #2  ? Title Pt's FOTO score will improve from 59pts to > 67 pts in order improve QOL  ( 11/14/21: 80 pts, 02/08/22 - 87 pts )   ? Time 10   ? Period Weeks   ? Status Achieved   ? Target Date 11/09/21   ?  ? PT LONG TERM GOAL #3  ? Title Pt will get screened for sleep study to rule in/out OSA to improve sleep quality for pain management and nocturia   ? Time 4   ? Period Weeks   ? Status Achieved   ? Target Date 09/28/21   ?  ? PT LONG TERM GOAL #4  ? Title Pt will demo more posterior rotation of L UE in gait, less T/L junction convex , levelled pelvic girdle / shoulders in order to minimize LBP and pain and return to standing and squat   ? Baseline L iliac crest higher, R shoulder higher   ? Time 6   ? Period Weeks   ? Status Achieved   ? Target Date 10/13/21   ?  ? PT LONG TERM GOAL #5  ? Title Pt will  be IND and compliant with casting practice with LUE to promote posterior rotation of thorax/ minimize LBP and continue flyfishing with less risk for injuries   ? Time 8   ? Period Weeks   ? Status Achieved   ? Target Date 10/27/21   ?  ? PT LONG TERM GOAL #6  ? Title P twill IND with lower kinetic chain stretches and demonstrate more mobility in foot in DF/EV and in hamstrings  and eccentric control of plantar fascia   ? Time 10   ? Period Weeks   ? Status On-going   ? Target Date 01/16/22   ?  ? PT LONG TERM GOAL #7  ? Title Pt will demo increased reps for PF with single UE support from 15 reps to > 20 reps in order to improve L5/S1 myotome function and improve balance for flyfishing, stairs .  (02/08/22: 20 reps)   ? Time 10   ? Period Weeks   ? Status Achieved   ? Target Date 01/16/22   ?  ?  PT LONG TERM GOAL #8  ? Title Pt will demo less knee IR /adduction in  lunge position in  fitness exericses to minimzie overtightness of IT band/ quad and more scapular/cervical stabilization with resistance band exericses to minmize relapse of injuries in order to return to strength training in a well rounded way   ? Time 10   ? Period Weeks   ? Status Achieved   ? Target Date 03/06/22   ? ?  ?  ? ?  ? ? ? ? ? ? ? ? Plan - 02/09/22 1053   ? ? Clinical Impression Statement Pt has achieved 100% of her goals. Pt's achievements include: ? ?-return to fly fishing, traveling out of the country, and fitness routine, moving and lifting boxes during a move to another city without relapse of LBP  ?-corrected alignment of spine and pelvic alightment ?-mobility of mm from neck to back, SIJ, lower kinetic chain, including feet ?-increased hip abduction and feet strength, increased AROM at hip and SIJ and hamstrings  ?-IND with stretches to minimize repeated movement to one side with flyfishing and practice ?-IND with stretches and balance training  ? ?Pt also complied with referral for sleep study and pt was positive for OSA and has been wearing her CPAP machine. Pt's nocturia and sleep quality has improved significantly.  ? ?Pt is ready for d/c at this time.   ? Comorbidities HTN, Past surgeries neck and back, BPH   ? Examination-Activity Limitations Locomotion Level;Sleep;Stand;Squat   ? Stability/Clinical Decision Making Evolving/Moderate complexity   ? Rehab Potential Good   ? PT Frequency 1x / week   10  ? PT Duration Other (comment)   10  ? PT Treatment/Interventions Therapeutic activities;Therapeutic exercise;Neuromuscular re-education;Patient/family education;Moist Heat;Cryotherapy;Manual techniques;Taping;Balance training;ADLs/Self Care Home Management;Gait training;Joint Manipulations;Scar mobilization   ? Consulted and Agree with Plan of Care Patient   ? ?  ?  ? ?  ? ? ?Patient will benefit from skilled therapeutic intervention in order to improve the following deficits and impairments:   Abnormal gait, Decreased coordination, Decreased endurance, Decreased range of motion, Decreased activity tolerance, Decreased mobility, Decreased scar mobility, Decreased strength, Difficulty walking

## 2022-02-22 ENCOUNTER — Encounter: Payer: Medicare Other | Admitting: Physical Therapy

## 2024-03-21 ENCOUNTER — Other Ambulatory Visit: Payer: Self-pay | Admitting: Internal Medicine

## 2024-03-21 DIAGNOSIS — M5417 Radiculopathy, lumbosacral region: Secondary | ICD-10-CM

## 2024-03-21 DIAGNOSIS — M5137 Other intervertebral disc degeneration, lumbosacral region with discogenic back pain only: Secondary | ICD-10-CM

## 2024-03-28 ENCOUNTER — Ambulatory Visit
Admission: RE | Admit: 2024-03-28 | Discharge: 2024-03-28 | Disposition: A | Discharge status: Home / Self Care | Source: Ambulatory Visit | Attending: Internal Medicine | Admitting: Internal Medicine

## 2024-03-28 DIAGNOSIS — M5417 Radiculopathy, lumbosacral region: Secondary | ICD-10-CM

## 2024-03-28 DIAGNOSIS — M5137 Other intervertebral disc degeneration, lumbosacral region with discogenic back pain only: Secondary | ICD-10-CM | POA: Diagnosis present
